# Patient Record
Sex: Female | Born: 1935 | Race: White | Hispanic: No | Smoking: Never smoker
Health system: Southern US, Community
[De-identification: ages and names within clinical notes are randomized; demographics above are authoritative.]

## PROBLEM LIST (undated history)

## (undated) DIAGNOSIS — I10 Essential (primary) hypertension: Secondary | ICD-10-CM

## (undated) DIAGNOSIS — E785 Hyperlipidemia, unspecified: Secondary | ICD-10-CM

## (undated) DIAGNOSIS — R42 Dizziness and giddiness: Secondary | ICD-10-CM

## (undated) DIAGNOSIS — F419 Anxiety disorder, unspecified: Secondary | ICD-10-CM

## (undated) DIAGNOSIS — Z66 Do not resuscitate: Secondary | ICD-10-CM

## (undated) DIAGNOSIS — K8020 Calculus of gallbladder without cholecystitis without obstruction: Secondary | ICD-10-CM

## (undated) DIAGNOSIS — R410 Disorientation, unspecified: Secondary | ICD-10-CM

## (undated) DIAGNOSIS — M1990 Unspecified osteoarthritis, unspecified site: Secondary | ICD-10-CM

## (undated) DIAGNOSIS — E559 Vitamin D deficiency, unspecified: Secondary | ICD-10-CM

## (undated) DIAGNOSIS — I6529 Occlusion and stenosis of unspecified carotid artery: Secondary | ICD-10-CM

## (undated) DIAGNOSIS — G709 Myoneural disorder, unspecified: Secondary | ICD-10-CM

## (undated) HISTORY — DX: Dizziness and giddiness: R42

## (undated) HISTORY — DX: Calculus of gallbladder without cholecystitis without obstruction: K80.20

## (undated) HISTORY — DX: Essential (primary) hypertension: I10

## (undated) HISTORY — DX: Vitamin D deficiency, unspecified: E55.9

## (undated) HISTORY — DX: Occlusion and stenosis of unspecified carotid artery: I65.29

## (undated) HISTORY — DX: Hyperlipidemia, unspecified: E78.5

## (undated) HISTORY — DX: Myoneural disorder, unspecified: G70.9

## (undated) HISTORY — DX: Anxiety disorder, unspecified: F41.9

## (undated) HISTORY — DX: Unspecified osteoarthritis, unspecified site: M19.90

---

## 2004-04-14 ENCOUNTER — Ambulatory Visit: Payer: Self-pay | Admitting: Family Medicine

## 2004-09-30 ENCOUNTER — Ambulatory Visit: Payer: Self-pay | Admitting: Family Medicine

## 2004-10-22 ENCOUNTER — Ambulatory Visit: Payer: Self-pay | Admitting: Family Medicine

## 2004-12-18 ENCOUNTER — Ambulatory Visit: Payer: Self-pay | Admitting: Family Medicine

## 2005-02-05 ENCOUNTER — Ambulatory Visit: Payer: Self-pay | Admitting: Family Medicine

## 2005-05-14 ENCOUNTER — Ambulatory Visit: Payer: Self-pay | Admitting: Family Medicine

## 2005-05-15 ENCOUNTER — Ambulatory Visit: Payer: Self-pay | Admitting: Family Medicine

## 2005-05-21 ENCOUNTER — Ambulatory Visit: Payer: Self-pay | Admitting: Family Medicine

## 2005-05-22 ENCOUNTER — Ambulatory Visit: Payer: Self-pay | Admitting: Cardiology

## 2005-05-25 ENCOUNTER — Ambulatory Visit: Payer: Self-pay | Admitting: Cardiology

## 2005-05-28 ENCOUNTER — Ambulatory Visit: Payer: Self-pay | Admitting: Cardiology

## 2005-07-27 ENCOUNTER — Ambulatory Visit: Payer: Self-pay | Admitting: Family Medicine

## 2005-10-01 ENCOUNTER — Ambulatory Visit: Payer: Self-pay | Admitting: Family Medicine

## 2006-01-26 ENCOUNTER — Ambulatory Visit: Payer: Self-pay | Admitting: Family Medicine

## 2006-03-10 ENCOUNTER — Ambulatory Visit: Payer: Self-pay | Admitting: Physician Assistant

## 2006-06-01 ENCOUNTER — Ambulatory Visit: Payer: Self-pay | Admitting: Family Medicine

## 2006-07-12 ENCOUNTER — Ambulatory Visit: Payer: Self-pay | Admitting: Family Medicine

## 2006-10-12 ENCOUNTER — Ambulatory Visit: Payer: Self-pay | Admitting: Family Medicine

## 2008-03-08 ENCOUNTER — Ambulatory Visit (HOSPITAL_COMMUNITY): Admission: RE | Admit: 2008-03-08 | Discharge: 2008-03-08 | Payer: Self-pay | Admitting: Family Medicine

## 2008-07-22 ENCOUNTER — Emergency Department (HOSPITAL_COMMUNITY): Admission: EM | Admit: 2008-07-22 | Discharge: 2008-07-22 | Payer: Self-pay | Admitting: Emergency Medicine

## 2008-12-15 ENCOUNTER — Emergency Department (HOSPITAL_COMMUNITY): Admission: EM | Admit: 2008-12-15 | Discharge: 2008-12-15 | Payer: Self-pay | Admitting: Emergency Medicine

## 2009-08-15 ENCOUNTER — Emergency Department (HOSPITAL_COMMUNITY): Admission: EM | Admit: 2009-08-15 | Discharge: 2009-08-15 | Payer: Self-pay | Admitting: Emergency Medicine

## 2009-12-07 ENCOUNTER — Emergency Department (HOSPITAL_COMMUNITY): Admission: EM | Admit: 2009-12-07 | Discharge: 2009-12-08 | Payer: Self-pay | Admitting: Emergency Medicine

## 2009-12-29 ENCOUNTER — Emergency Department (HOSPITAL_COMMUNITY): Admission: EM | Admit: 2009-12-29 | Discharge: 2009-12-30 | Payer: Self-pay | Admitting: Emergency Medicine

## 2010-02-14 ENCOUNTER — Emergency Department (HOSPITAL_COMMUNITY): Admission: EM | Admit: 2010-02-14 | Discharge: 2010-02-14 | Payer: Self-pay | Admitting: Emergency Medicine

## 2010-04-14 ENCOUNTER — Emergency Department (HOSPITAL_COMMUNITY): Admission: EM | Admit: 2010-04-14 | Discharge: 2010-04-14 | Payer: Self-pay | Admitting: Emergency Medicine

## 2010-07-29 LAB — CBC
HCT: 41.8 % (ref 36.0–46.0)
Hemoglobin: 14.5 g/dL (ref 12.0–15.0)
MCH: 31 pg (ref 26.0–34.0)
MCHC: 34.7 g/dL (ref 30.0–36.0)
MCV: 89.5 fL (ref 78.0–100.0)
Platelets: 295 10*3/uL (ref 150–400)
RBC: 4.67 MIL/uL (ref 3.87–5.11)
RDW: 13.6 % (ref 11.5–15.5)
WBC: 10.2 10*3/uL (ref 4.0–10.5)

## 2010-07-29 LAB — DIFFERENTIAL
Basophils Absolute: 0 10*3/uL (ref 0.0–0.1)
Basophils Relative: 0 % (ref 0–1)
Eosinophils Absolute: 0 10*3/uL (ref 0.0–0.7)
Eosinophils Relative: 0 % (ref 0–5)
Lymphocytes Relative: 22 % (ref 12–46)
Lymphs Abs: 2.3 10*3/uL (ref 0.7–4.0)
Monocytes Absolute: 1 10*3/uL (ref 0.1–1.0)
Monocytes Relative: 10 % (ref 3–12)
Neutro Abs: 6.8 10*3/uL (ref 1.7–7.7)
Neutrophils Relative %: 67 % (ref 43–77)

## 2010-07-29 LAB — BASIC METABOLIC PANEL
BUN: 7 mg/dL (ref 6–23)
CO2: 31 mEq/L (ref 19–32)
Calcium: 9.3 mg/dL (ref 8.4–10.5)
Chloride: 97 mEq/L (ref 96–112)
Creatinine, Ser: 0.94 mg/dL (ref 0.4–1.2)
GFR calc Af Amer: >60 mL/min (ref >60)
GFR calc non Af Amer: 56 mL/min — ABNORMAL LOW (ref >60)
Glucose, Bld: 104 mg/dL — ABNORMAL HIGH (ref 70–99)
Potassium: 4.4 mEq/L (ref 3.5–5.1)
Sodium: 135 mEq/L (ref 135–145)

## 2010-07-31 LAB — DIFFERENTIAL
Eosinophils Absolute: 0 10*3/uL (ref 0.0–0.7)
Lymphocytes Relative: 26 % (ref 12–46)
Lymphs Abs: 2.4 10*3/uL (ref 0.7–4.0)
Monocytes Relative: 11 % (ref 3–12)
Neutro Abs: 5.9 10*3/uL (ref 1.7–7.7)
Neutrophils Relative %: 63 % (ref 43–77)

## 2010-07-31 LAB — URINALYSIS, ROUTINE W REFLEX MICROSCOPIC
Bilirubin Urine: NEGATIVE
Protein, ur: NEGATIVE mg/dL
Specific Gravity, Urine: 1.007 (ref 1.005–1.030)
Urobilinogen, UA: 0.2 mg/dL (ref 0.0–1.0)

## 2010-07-31 LAB — CBC
Hemoglobin: 13.9 g/dL (ref 12.0–15.0)
MCH: 30.5 pg (ref 26.0–34.0)
RBC: 4.56 MIL/uL (ref 3.87–5.11)
WBC: 9.4 10*3/uL (ref 4.0–10.5)

## 2010-07-31 LAB — BASIC METABOLIC PANEL
CO2: 29 mEq/L (ref 19–32)
Calcium: 9.6 mg/dL (ref 8.4–10.5)
GFR calc Af Amer: 57 mL/min — ABNORMAL LOW (ref >60)
Sodium: 137 mEq/L (ref 135–145)

## 2010-07-31 LAB — POCT CARDIAC MARKERS
CKMB, poc: 2.4 ng/mL (ref 1.0–8.0)
Myoglobin, poc: 316 ng/mL (ref 12–200)
Troponin i, poc: <0.05 ng/mL (ref 0.00–0.09)

## 2010-07-31 LAB — URINE MICROSCOPIC-ADD ON

## 2010-08-01 LAB — URINALYSIS, ROUTINE W REFLEX MICROSCOPIC
Ketones, ur: NEGATIVE mg/dL
Leukocytes, UA: NEGATIVE
Nitrite: NEGATIVE
Protein, ur: NEGATIVE mg/dL
Urobilinogen, UA: 0.2 mg/dL (ref 0.0–1.0)

## 2010-08-01 LAB — CBC
MCH: 30.9 pg (ref 26.0–34.0)
MCHC: 33.8 g/dL (ref 30.0–36.0)
RDW: 13.2 % (ref 11.5–15.5)

## 2010-08-01 LAB — BASIC METABOLIC PANEL
BUN: 11 mg/dL (ref 6–23)
CO2: 31 mEq/L (ref 19–32)
Calcium: 9.2 mg/dL (ref 8.4–10.5)
GFR calc non Af Amer: 50 mL/min — ABNORMAL LOW (ref >60)
Glucose, Bld: 108 mg/dL — ABNORMAL HIGH (ref 70–99)

## 2010-08-01 LAB — DIFFERENTIAL
Basophils Absolute: 0.1 10*3/uL (ref 0.0–0.1)
Basophils Relative: 1 % (ref 0–1)
Eosinophils Absolute: 0.1 10*3/uL (ref 0.0–0.7)
Monocytes Absolute: 1 10*3/uL (ref 0.1–1.0)
Monocytes Relative: 12 % (ref 3–12)
Neutro Abs: 5 10*3/uL (ref 1.7–7.7)
Neutrophils Relative %: 57 % (ref 43–77)

## 2010-08-01 LAB — POCT CARDIAC MARKERS: CKMB, poc: 2.4 ng/mL (ref 1.0–8.0)

## 2010-08-01 LAB — URINE MICROSCOPIC-ADD ON

## 2010-08-02 LAB — URINE MICROSCOPIC-ADD ON

## 2010-08-02 LAB — URINALYSIS, ROUTINE W REFLEX MICROSCOPIC
Bilirubin Urine: NEGATIVE
Glucose, UA: NEGATIVE mg/dL
Specific Gravity, Urine: 1.01 (ref 1.005–1.030)
Urobilinogen, UA: 0.2 mg/dL (ref 0.0–1.0)

## 2010-08-11 LAB — BASIC METABOLIC PANEL
BUN: 7 mg/dL (ref 6–23)
Calcium: 9.3 mg/dL (ref 8.4–10.5)
Creatinine, Ser: 0.95 mg/dL (ref 0.4–1.2)
GFR calc non Af Amer: 55 mL/min — ABNORMAL LOW (ref >60)
Glucose, Bld: 108 mg/dL — ABNORMAL HIGH (ref 70–99)
Potassium: 3.9 mEq/L (ref 3.5–5.1)

## 2010-08-11 LAB — POCT CARDIAC MARKERS: CKMB, poc: 1.5 ng/mL (ref 1.0–8.0)

## 2010-08-11 LAB — URINALYSIS, ROUTINE W REFLEX MICROSCOPIC
Ketones, ur: NEGATIVE mg/dL
Leukocytes, UA: NEGATIVE
Nitrite: NEGATIVE
Protein, ur: NEGATIVE mg/dL
Urobilinogen, UA: 0.2 mg/dL (ref 0.0–1.0)

## 2010-08-11 LAB — URINE CULTURE
Colony Count: NO GROWTH
Culture: NO GROWTH

## 2010-08-11 LAB — CBC
HCT: 43.5 % (ref 36.0–46.0)
Platelets: 250 10*3/uL (ref 150–400)
RDW: 13.6 % (ref 11.5–15.5)
WBC: 9 10*3/uL (ref 4.0–10.5)

## 2010-08-11 LAB — URINE MICROSCOPIC-ADD ON

## 2010-08-11 LAB — DIFFERENTIAL
Basophils Absolute: 0 10*3/uL (ref 0.0–0.1)
Eosinophils Relative: 1 % (ref 0–5)
Lymphocytes Relative: 34 % (ref 12–46)
Neutrophils Relative %: 56 % (ref 43–77)

## 2010-08-11 LAB — BRAIN NATRIURETIC PEPTIDE: Pro B Natriuretic peptide (BNP): 90.7 pg/mL (ref 0.0–100.0)

## 2010-08-28 LAB — URINE MICROSCOPIC-ADD ON

## 2010-08-28 LAB — TSH: TSH: 2.888 u[IU]/mL (ref 0.350–4.500)

## 2010-08-28 LAB — POCT I-STAT, CHEM 8
Hemoglobin: 15.3 g/dL — ABNORMAL HIGH (ref 12.0–15.0)
Sodium: 136 mEq/L (ref 135–145)
TCO2: 27 mmol/L (ref 0–100)

## 2010-08-28 LAB — URINALYSIS, ROUTINE W REFLEX MICROSCOPIC
Glucose, UA: NEGATIVE mg/dL
Ketones, ur: NEGATIVE mg/dL
Leukocytes, UA: NEGATIVE
pH: 6 (ref 5.0–8.0)

## 2010-08-29 DIAGNOSIS — R011 Cardiac murmur, unspecified: Secondary | ICD-10-CM

## 2010-09-22 ENCOUNTER — Emergency Department (HOSPITAL_COMMUNITY): Payer: Medicare Other

## 2010-09-22 ENCOUNTER — Emergency Department (HOSPITAL_COMMUNITY)
Admission: EM | Admit: 2010-09-22 | Discharge: 2010-09-22 | Disposition: A | Discharge status: Home / Self Care | Payer: Medicare Other | Attending: Emergency Medicine | Admitting: Emergency Medicine

## 2010-09-22 DIAGNOSIS — IMO0002 Reserved for concepts with insufficient information to code with codable children: Secondary | ICD-10-CM | POA: Insufficient documentation

## 2010-09-22 DIAGNOSIS — I10 Essential (primary) hypertension: Secondary | ICD-10-CM | POA: Insufficient documentation

## 2010-09-22 DIAGNOSIS — R51 Headache: Secondary | ICD-10-CM | POA: Insufficient documentation

## 2010-09-22 DIAGNOSIS — H538 Other visual disturbances: Secondary | ICD-10-CM | POA: Insufficient documentation

## 2010-09-22 LAB — URINALYSIS, ROUTINE W REFLEX MICROSCOPIC
Glucose, UA: NEGATIVE mg/dL
Leukocytes, UA: NEGATIVE
Protein, ur: NEGATIVE mg/dL

## 2010-09-22 LAB — URINE MICROSCOPIC-ADD ON

## 2010-09-22 LAB — CBC
Hemoglobin: 14 g/dL (ref 12.0–15.0)
RBC: 4.63 MIL/uL (ref 3.87–5.11)
WBC: 10.8 10*3/uL — ABNORMAL HIGH (ref 4.0–10.5)

## 2010-09-22 LAB — DIFFERENTIAL
Basophils Absolute: 0 10*3/uL (ref 0.0–0.1)
Basophils Relative: 0 % (ref 0–1)
Neutro Abs: 6.1 10*3/uL (ref 1.7–7.7)
Neutrophils Relative %: 56 % (ref 43–77)

## 2010-09-22 LAB — BASIC METABOLIC PANEL
CO2: 33 mEq/L — ABNORMAL HIGH (ref 19–32)
Calcium: 10.2 mg/dL (ref 8.4–10.5)
Chloride: 96 mEq/L (ref 96–112)
GFR calc Af Amer: >60 mL/min (ref >60)
Sodium: 136 mEq/L (ref 135–145)

## 2010-11-17 ENCOUNTER — Other Ambulatory Visit: Payer: Medicare Other

## 2010-11-17 ENCOUNTER — Encounter: Payer: Medicare Other | Admitting: Vascular Surgery

## 2010-12-16 ENCOUNTER — Emergency Department (HOSPITAL_COMMUNITY)
Admission: EM | Admit: 2010-12-16 | Discharge: 2010-12-16 | Disposition: A | Discharge status: Home / Self Care | Payer: Medicare Other | Attending: Emergency Medicine | Admitting: Emergency Medicine

## 2010-12-16 ENCOUNTER — Emergency Department (HOSPITAL_COMMUNITY): Payer: Medicare Other

## 2010-12-16 DIAGNOSIS — I10 Essential (primary) hypertension: Secondary | ICD-10-CM | POA: Insufficient documentation

## 2010-12-16 DIAGNOSIS — R5381 Other malaise: Secondary | ICD-10-CM | POA: Insufficient documentation

## 2010-12-16 DIAGNOSIS — R209 Unspecified disturbances of skin sensation: Secondary | ICD-10-CM | POA: Insufficient documentation

## 2010-12-16 DIAGNOSIS — E860 Dehydration: Secondary | ICD-10-CM | POA: Insufficient documentation

## 2010-12-16 DIAGNOSIS — Z79899 Other long term (current) drug therapy: Secondary | ICD-10-CM | POA: Insufficient documentation

## 2010-12-16 DIAGNOSIS — R5383 Other fatigue: Secondary | ICD-10-CM | POA: Insufficient documentation

## 2010-12-16 LAB — BASIC METABOLIC PANEL
CO2: 30 mEq/L (ref 19–32)
Chloride: 86 mEq/L — ABNORMAL LOW (ref 96–112)
Potassium: 4.4 mEq/L (ref 3.5–5.1)
Sodium: 125 mEq/L — ABNORMAL LOW (ref 135–145)

## 2010-12-16 LAB — DIFFERENTIAL
Eosinophils Absolute: 0.1 10*3/uL (ref 0.0–0.7)
Eosinophils Relative: 0 % (ref 0–5)
Lymphs Abs: 1.4 10*3/uL (ref 0.7–4.0)
Monocytes Relative: 8 % (ref 3–12)

## 2010-12-16 LAB — URINALYSIS, ROUTINE W REFLEX MICROSCOPIC
Bilirubin Urine: NEGATIVE
Glucose, UA: NEGATIVE mg/dL
Hgb urine dipstick: NEGATIVE
Ketones, ur: NEGATIVE mg/dL
pH: 6.5 (ref 5.0–8.0)

## 2010-12-16 LAB — TROPONIN I: Troponin I: <0.3 ng/mL (ref <0.30)

## 2010-12-16 LAB — CBC
MCH: 31.4 pg (ref 26.0–34.0)
MCHC: 35.9 g/dL (ref 30.0–36.0)
MCV: 87.3 fL (ref 78.0–100.0)
Platelets: 273 10*3/uL (ref 150–400)
RDW: 12.4 % (ref 11.5–15.5)
WBC: 13.8 10*3/uL — ABNORMAL HIGH (ref 4.0–10.5)

## 2012-08-09 ENCOUNTER — Other Ambulatory Visit: Payer: Self-pay | Admitting: Family Medicine

## 2012-08-09 DIAGNOSIS — E039 Hypothyroidism, unspecified: Secondary | ICD-10-CM

## 2012-08-09 DIAGNOSIS — G894 Chronic pain syndrome: Secondary | ICD-10-CM

## 2012-08-09 NOTE — Telephone Encounter (Signed)
Routed to dr. Modesto Charon

## 2012-08-09 NOTE — Telephone Encounter (Signed)
Needs refills on thyroid and pain meds. [facility] pharmacy

## 2012-08-10 ENCOUNTER — Other Ambulatory Visit: Payer: Self-pay | Admitting: Family Medicine

## 2012-08-10 DIAGNOSIS — E039 Hypothyroidism, unspecified: Secondary | ICD-10-CM

## 2012-08-10 MED ORDER — HYDROCODONE-ACETAMINOPHEN 7.5-325 MG PO TABS: 0.5000 | ORAL_TABLET | Freq: Three times a day (TID) | ORAL | Status: DC

## 2012-08-10 MED ORDER — LEVOTHYROXINE SODIUM 50 MCG PO TABS: ORAL_TABLET | ORAL | Status: DC

## 2012-08-10 NOTE — Telephone Encounter (Signed)
meds not called in yet to [facility]. Out of thyroid meds. And another med is due to be filled also.

## 2012-08-10 NOTE — Telephone Encounter (Signed)
Spoke with Kathie Rhodes and will come pick up rx's.

## 2012-08-18 ENCOUNTER — Telehealth: Payer: Self-pay | Admitting: Family Medicine

## 2012-08-18 NOTE — Telephone Encounter (Signed)
BP ISSUES- APPT MADE

## 2012-08-23 ENCOUNTER — Ambulatory Visit (INDEPENDENT_AMBULATORY_CARE_PROVIDER_SITE_OTHER): Payer: Medicare Other | Admitting: Family Medicine

## 2012-08-23 ENCOUNTER — Encounter: Payer: Self-pay | Admitting: Family Medicine

## 2012-08-23 VITALS — BP 204/77 | HR 75 | Temp 97.5°F | Wt 188.0 lb

## 2012-08-23 DIAGNOSIS — B0229 Other postherpetic nervous system involvement: Secondary | ICD-10-CM

## 2012-08-23 DIAGNOSIS — N951 Menopausal and female climacteric states: Secondary | ICD-10-CM

## 2012-08-23 DIAGNOSIS — E559 Vitamin D deficiency, unspecified: Secondary | ICD-10-CM

## 2012-08-23 DIAGNOSIS — I6523 Occlusion and stenosis of bilateral carotid arteries: Secondary | ICD-10-CM

## 2012-08-23 DIAGNOSIS — M1990 Unspecified osteoarthritis, unspecified site: Secondary | ICD-10-CM | POA: Insufficient documentation

## 2012-08-23 DIAGNOSIS — F411 Generalized anxiety disorder: Secondary | ICD-10-CM

## 2012-08-23 DIAGNOSIS — I658 Occlusion and stenosis of other precerebral arteries: Secondary | ICD-10-CM

## 2012-08-23 DIAGNOSIS — R109 Unspecified abdominal pain: Secondary | ICD-10-CM

## 2012-08-23 DIAGNOSIS — I6529 Occlusion and stenosis of unspecified carotid artery: Secondary | ICD-10-CM | POA: Insufficient documentation

## 2012-08-23 DIAGNOSIS — K8020 Calculus of gallbladder without cholecystitis without obstruction: Secondary | ICD-10-CM

## 2012-08-23 DIAGNOSIS — R232 Flushing: Secondary | ICD-10-CM

## 2012-08-23 DIAGNOSIS — I10 Essential (primary) hypertension: Secondary | ICD-10-CM

## 2012-08-23 DIAGNOSIS — M129 Arthropathy, unspecified: Secondary | ICD-10-CM

## 2012-08-23 MED ORDER — AMLODIPINE BESYLATE-VALSARTAN 5-160 MG PO TABS: 1.0000 | ORAL_TABLET | Freq: Every day | ORAL | Status: DC

## 2012-08-23 NOTE — Assessment & Plan Note (Signed)
Stable

## 2012-08-23 NOTE — Assessment & Plan Note (Signed)
BPs readings brought by caretakers. Patient has hot flushings and anxiety with sudden surges of BPs as high as systolic in low 200s. Then her BP would fall to acceptably normal levels. Recordings are brought. Some amount of anxiety associated with BP surges , especially when alone.

## 2012-08-23 NOTE — Progress Notes (Signed)
Patient ID: Catherine Steele, female   DOB: 02/09/1919, 79 y.o.   MRN: [id number] SUBJECTIVE:  HPI: Has been having more hhot flushing and feeling her BP surging at times.   PMH/PSH: reviewed/updated in Epic  SH/FH: reviewed/updated in Epic  Allergies: reviewed/updated in Epic  Medications: reviewed/updated in Epic  Immunizations: reviewed/updated in Epic   ROS: As above in the HPI. All other systems are stable or negative.   OBJECTIVE: APPEARANCE:  Patient in no acute distress.The patient appeared well nourished and normally developed. Acyanotic.  Waist:obese.  VITAL SIGNS:BP 204/77  Pulse 75  Temp(Src) 97.5 F (36.4 C) (Oral)  Wt 188 lb (85.276 kg)   SKIN: warm and  Dry without overt rashes, tattoos and scars  HEAD and Neck: without JVD, Normal No scleral icterus  CHEST & LUNGS: Clear  CVS: Reveals the PMI to be normally located. Regular rhythm, First and Second Heart sounds are normal, and absence of murmurs, rubs or gallops.  ABDOMEN:  Benign,, no organomegaly, no masses, no Abdominal Aortic enlargement. No Guarding , no rebound. No Bruits.  RECTAL:n/a  GU:n/a  EXTREMETIES: ambulates with a walker  MUSCULOSKELETAL:   NEUROLOGIC: oriented to,place and person; nonfocal in extremities.  ASSESSMENT: HTN (hypertension) BPs readings brought by caretakers. Patient has hot flushings and anxiety with sudden surges of BPs as high as systolic in low 200s. Then her BP would fall to acceptably normal levels. Recordings are brought. Some amount of anxiety associated with BP surges , especially when alone.  Post herpetic neuralgia No major changes.  Arthritis Chronic and ambulates with a walker and assistance.  Carotid stenosis No symptoms.  Generalized anxiety disorder Major issue since she lives alone and has to hire caretakers. The caretakers have done a good job so far and patient appears to be happy with them.  Gallstones No change  Unspecified  vitamin D deficiency Stable   PLAN: Orders Placed This Encounter  Procedures  . Metanephrines, urine, 24 hour    Standing Status: Future     Number of Occurrences:      Standing Expiration Date: 08/23/2013  . Catecholamines, fractionated, urine, 24 hour    Standing Status: Future     Number of Occurrences:      Standing Expiration Date: 08/23/2013  . VMA, urine, 24 hour    Standing Status: Future     Number of Occurrences:      Standing Expiration Date: 08/23/2013    Order Specific Question:  Total Volume    Answer:  1800  . 5 HIAA, quantitative, urine, 24 hour    Standing Status: Future     Number of Occurrences:      Standing Expiration Date: 08/23/2013   No results found for this or any previous visit (from the past 24 hour(s)).                                 Meds ordered this encounter  Medications  . DISCONTD: amLODipine (NORVASC) 5 MG tablet    Sig: Take 5 mg by mouth daily.  . cloNIDine (CATAPRES) 0.1 MG tablet    Sig: Take 0.1 mg by mouth 3 (three) times daily.  Marland Kitchen atenolol (TENORMIN) 50 MG tablet    Sig: Take 50 mg by mouth daily. Take 2 tablets qd  . ALPRAZolam (XANAX) 0.25 MG tablet    Sig: Take 0.25 mg by mouth at bedtime as needed for sleep.  Marland Kitchen aspirin  325 MG tablet    Sig: Take 325 mg by mouth daily.  . Cholecalciferol (VITAMIN D3) 2000 UNITS TABS    Sig: Take 1 tablet by mouth daily.  Marland Kitchen amLODipine-valsartan (EXFORGE) 5-160 MG per tablet    Sig: Take 1 tablet by mouth daily.    Dispense:  30 tablet    Refill:  2    await work up for her BP surges. In the meantime will attempt to bring her BP to safer levels. Reviewed her BP readings and there are wide swings to the BP readings. RTC 1 week.  Kyeisha Janowicz P. Modesto Charon, M.D.

## 2012-08-23 NOTE — Assessment & Plan Note (Signed)
No change 

## 2012-08-23 NOTE — Assessment & Plan Note (Signed)
No major changes.

## 2012-08-23 NOTE — Assessment & Plan Note (Signed)
No symptoms 

## 2012-08-23 NOTE — Assessment & Plan Note (Signed)
Major issue since she lives alone and has to hire caretakers. The caretakers have done a good job so far and patient appears to be happy with them.

## 2012-08-23 NOTE — Assessment & Plan Note (Signed)
Chronic and ambulates with a walker and assistance.

## 2012-08-24 ENCOUNTER — Encounter: Payer: Self-pay | Admitting: Family Medicine

## 2012-08-25 ENCOUNTER — Other Ambulatory Visit: Payer: Medicare Other

## 2012-08-25 DIAGNOSIS — R232 Flushing: Secondary | ICD-10-CM

## 2012-08-25 DIAGNOSIS — R109 Unspecified abdominal pain: Secondary | ICD-10-CM

## 2012-08-25 DIAGNOSIS — I10 Essential (primary) hypertension: Secondary | ICD-10-CM

## 2012-08-26 ENCOUNTER — Other Ambulatory Visit (INDEPENDENT_AMBULATORY_CARE_PROVIDER_SITE_OTHER): Payer: Medicare Other

## 2012-08-26 ENCOUNTER — Other Ambulatory Visit: Payer: Self-pay | Admitting: *Deleted

## 2012-08-26 DIAGNOSIS — I10 Essential (primary) hypertension: Secondary | ICD-10-CM

## 2012-08-26 DIAGNOSIS — R109 Unspecified abdominal pain: Secondary | ICD-10-CM

## 2012-08-26 DIAGNOSIS — R232 Flushing: Secondary | ICD-10-CM

## 2012-08-26 NOTE — Progress Notes (Unsigned)
PT DROPPED OFF 24 HOUR URINE SPECIMEN

## 2012-08-26 NOTE — Addendum Note (Signed)
Addended by: Orma Render F on: 08/26/2012 04:27 PM   Modules accepted: Orders

## 2012-08-29 LAB — METANEPHRINES, URINE, 24 HOUR
Metaneph Total, Ur: 47 ug/L
Metanephrines, 24H Ur: 56 ug/24 hr (ref 45–290)
Normetanephrine, 24H Ur: 198 ug/24 hr (ref 82–500)
Normetanephrine, Ur: 165 ug/L

## 2012-08-30 LAB — VMA, URINE, 24 HOUR
VMA, 24H Ur Adult: 2.6 mg/24 hr (ref 0.0–7.5)
VMA, Urine: 2.2 mg/L

## 2012-08-31 ENCOUNTER — Encounter: Payer: Self-pay | Admitting: Family Medicine

## 2012-08-31 ENCOUNTER — Ambulatory Visit (INDEPENDENT_AMBULATORY_CARE_PROVIDER_SITE_OTHER): Payer: Medicare Other | Admitting: Family Medicine

## 2012-08-31 VITALS — BP 144/58 | HR 78 | Temp 96.6°F | Ht 64.0 in | Wt 189.0 lb

## 2012-08-31 DIAGNOSIS — F411 Generalized anxiety disorder: Secondary | ICD-10-CM

## 2012-08-31 DIAGNOSIS — R232 Flushing: Secondary | ICD-10-CM

## 2012-08-31 DIAGNOSIS — I10 Essential (primary) hypertension: Secondary | ICD-10-CM

## 2012-08-31 DIAGNOSIS — N951 Menopausal and female climacteric states: Secondary | ICD-10-CM

## 2012-08-31 MED ORDER — ALPRAZOLAM 0.25 MG PO TABS: 0.2500 mg | ORAL_TABLET | Freq: Every evening | ORAL | Status: DC | PRN

## 2012-08-31 NOTE — Progress Notes (Signed)
Patient ID: Catherine Steele, female   DOB: 1918-09-30, 79 y.o.   MRN: [id number] SUBJECTIVE: HPI: Patient is here for follow up of hypertension: denies Headache;deniesChest Pain;denies weakness;denies Shortness of Breath or Orthopnea;denies Visual changes;denies palpitations;denies cough;denies pedal edema;denies symptoms of TIA or stroke; admits to Compliance with medications. denies Problems with medications.  Has noticed red bumps which has turned to brown, and is afraid that this is shingles that has returned. They are painless, and she has had them for years.  PMH/PSH: reviewed/updated in Epic  SH/FH: reviewed/updated in Epic  Allergies: reviewed/updated in Epic  Medications: reviewed/updated in Epic  Immunizations: reviewed/updated in Epic  ROS: As above in the HPI. All other systems are stable or negative.  OBJECTIVE: APPEARANCE:  Anxious obese elderly lady, who is calmer in the presence of her two new home care helpers. Patient in no acute distress.The patient appeared well nourished and normally developed. Acyanotic. Waist: VITAL SIGNS:BP 144/58  Pulse 78  Temp(Src) 96.6 F (35.9 C) (Oral)  Ht 5\' 4"  (1.626 m)  Wt 189 lb (85.73 kg)  BMI 32.43 kg/m2   SKIN: warm and  Dry without overt rashes, tattoos and scars. The lesions that she is anxious about are all normal Seborrheic Keratosis.  HEAD and Neck: without JVD, Head and scalp: normal Eyes:No scleral icterus. Fundi normal, eye movements normal. Ears: Auricle normal, canal normal, Tympanic membranes normal, insufflation normal. Nose: normal Throat: normal Neck & thyroid: normal  CHEST & LUNGS: Chest wall: normal Lungs: Clear  CVS: Reveals the PMI to be normally located. Regular rhythm, First and Second Heart sounds are normal,  absence of murmurs, rubs or gallops. Peripheral vasculature: Radial pulses: normal  ABDOMEN:  Appearance: normal Benign,, no organomegaly, no masses, no Abdominal Aortic  enlargement. No Guarding , no rebound. No Bruits. Bowel sounds: normal  RECTAL: N/A GU: N/A  EXTREMETIES: nonedematous. Both Femoral and Pedal pulses are normal.  MUSCULOSKELETAL:  Spine: supple, ambulates with a rolling walker.  NEUROLOGIC: oriented to time,place and person; nonfocal.  ASSESSMENT: HTN (hypertension) - Better now with medication adjustment.  Generalized anxiety disorder - this is her main problem. With the 2 new homecare providers she has less complaints of acute symptoms.She is afraid of dying alone in the home,  - Plan: ALPRAZolam (XANAX) 0.25 MG tablet  Hot flashes  The main problem is her anxiety as related to aging and fear of dying alone. She is coping better with the home care helpers. BP is better now and acceptable.  PLAN: No orders of the defined types were placed in this encounter.   No results found for this or any previous visit (from the past 24 hour(s)). Meds ordered this encounter  Medications  . amLODipine-valsartan (EXFORGE) 5-320 MG per tablet    Sig: Take 1 tablet by mouth daily.  Marland Kitchen ALPRAZolam (XANAX) 0.25 MG tablet    Sig: Take 1 tablet (0.25 mg total) by mouth at bedtime as needed for sleep.    Dispense:  30 tablet    Refill:  1  Reviewed labs. The VMA is back normal. The 5 HIIA is not back yet.= This is to rule out a carcinoid tumor that would produce hot flashes and elevated blood pressure. Reviewed her meds. No change in regimen at this point. I believe we have rached stability with an acceptable BP for age.  RTC in 6 weeks  Selin Eisler P. Modesto Charon, M.D.

## 2012-09-01 ENCOUNTER — Ambulatory Visit: Payer: Self-pay | Admitting: Family Medicine

## 2012-09-01 LAB — METANEPHRINES, URINE, 24 HOUR
Metaneph Total, Ur: 46 ug/L
Metanephrines, 24H Ur: 60 ug/24 hr (ref 45–290)
Normetanephrine, 24H Ur: 224 ug/24 hr (ref 82–500)
Normetanephrine, Ur: 172 ug/L

## 2012-09-01 LAB — 5 HIAA, QUANTITATIVE, URINE, 24 HOUR

## 2012-09-03 ENCOUNTER — Emergency Department (HOSPITAL_COMMUNITY)
Admission: EM | Admit: 2012-09-03 | Discharge: 2012-09-03 | Disposition: A | Discharge status: Home / Self Care | Payer: Medicare Other | Attending: Emergency Medicine | Admitting: Emergency Medicine

## 2012-09-03 DIAGNOSIS — Z7982 Long term (current) use of aspirin: Secondary | ICD-10-CM | POA: Insufficient documentation

## 2012-09-03 DIAGNOSIS — F411 Generalized anxiety disorder: Secondary | ICD-10-CM | POA: Insufficient documentation

## 2012-09-03 DIAGNOSIS — B0229 Other postherpetic nervous system involvement: Secondary | ICD-10-CM

## 2012-09-03 DIAGNOSIS — R51 Headache: Secondary | ICD-10-CM | POA: Insufficient documentation

## 2012-09-03 DIAGNOSIS — Z79899 Other long term (current) drug therapy: Secondary | ICD-10-CM | POA: Insufficient documentation

## 2012-09-03 DIAGNOSIS — I10 Essential (primary) hypertension: Secondary | ICD-10-CM

## 2012-09-03 DIAGNOSIS — Z8679 Personal history of other diseases of the circulatory system: Secondary | ICD-10-CM | POA: Insufficient documentation

## 2012-09-03 DIAGNOSIS — Z8639 Personal history of other endocrine, nutritional and metabolic disease: Secondary | ICD-10-CM | POA: Insufficient documentation

## 2012-09-03 DIAGNOSIS — Z862 Personal history of diseases of the blood and blood-forming organs and certain disorders involving the immune mechanism: Secondary | ICD-10-CM | POA: Insufficient documentation

## 2012-09-03 DIAGNOSIS — E559 Vitamin D deficiency, unspecified: Secondary | ICD-10-CM | POA: Insufficient documentation

## 2012-09-03 LAB — POCT I-STAT, CHEM 8
BUN: 10 mg/dL (ref 6–23)
Chloride: 101 mEq/L (ref 96–112)
Sodium: 139 mEq/L (ref 135–145)
TCO2: 33 mmol/L (ref 0–100)

## 2012-09-03 MED ORDER — GABAPENTIN 300 MG PO CAPS
300.0000 mg | ORAL_CAPSULE | Freq: Once | ORAL | Status: AC
  Administered 2012-09-03: 300 mg via ORAL
  Filled 2012-09-03: qty 1

## 2012-09-03 MED ORDER — FENTANYL CITRATE 0.05 MG/ML IJ SOLN
50.0000 ug | Freq: Once | INTRAMUSCULAR | Status: AC
  Administered 2012-09-03: 50 ug via INTRAVENOUS
  Filled 2012-09-03: qty 2

## 2012-09-03 MED ORDER — CLONIDINE HCL 0.1 MG PO TABS
0.1000 mg | ORAL_TABLET | Freq: Once | ORAL | Status: AC
  Administered 2012-09-03: 0.1 mg via ORAL
  Filled 2012-09-03: qty 1

## 2012-09-03 MED ORDER — GABAPENTIN 300 MG PO CAPS: 300.0000 mg | ORAL_CAPSULE | Freq: Three times a day (TID) | ORAL | Status: DC

## 2012-09-03 NOTE — ED Provider Notes (Signed)
History     CSN: [id number]  Arrival date & time 09/03/12  1308   First MD Initiated Contact with Patient 09/03/12 0701      Chief Complaint  Patient presents with  . Facial Pain  . Hypertension    HPI According to EMS, the patient has had pain for 60years. Today she called because the pain to her left cheek got severe and so she took her own pain medication and called EMS. The patient says she had a tooth infection 60years ago and has also had shingles, however, EMS did not see any visible blisters. EMS is not sure of the patient's complaint other than facial pain, however she is hypertensive. The patient denies CP, SOB or Diaphoresis. Dr. Modesto Charon is managing her hypertension  Past Medical History  Diagnosis Date  . Anxiety   . Hyperlipidemia   . Vertigo   . Carotid stenosis   . Vitamin D deficiency     No past surgical history on file.  No family history on file.  History  Substance Use Topics  . Smoking status: Never Smoker   . Smokeless tobacco: Not on file  . Alcohol Use: No    OB History   Grav Para Term Preterm Abortions TAB SAB Ect Mult Living                  Review of Systems All other systems reviewed and are negative Allergies  Codeine and Niacin and related  Home Medications   Current Outpatient Rx  Name  Route  Sig  Dispense  Refill  . ALPRAZolam (XANAX) 0.25 MG tablet   Oral   Take 0.125 mg by mouth at bedtime as needed for sleep.         Marland Kitchen amLODipine (NORVASC) 5 MG tablet   Oral   Take 5 mg by mouth daily.         Marland Kitchen amLODipine-valsartan (EXFORGE) 5-320 MG per tablet   Oral   Take 1 tablet by mouth daily.         Marland Kitchen aspirin 325 MG tablet   Oral   Take 325 mg by mouth daily.         Marland Kitchen atenolol (TENORMIN) 50 MG tablet   Oral   Take 100 mg by mouth daily.          . Cholecalciferol (VITAMIN D3) 2000 UNITS TABS   Oral   Take 1 tablet by mouth daily.         . cloNIDine (CATAPRES) 0.1 MG tablet   Oral   Take 0.1 mg by  mouth 3 (three) times daily.         Marland Kitchen HYDROcodone-acetaminophen (NORCO) 7.5-325 MG per tablet   Oral   Take 0.5 tablets by mouth 3 (three) times daily as needed for pain.         Marland Kitchen levothyroxine (SYNTHROID, LEVOTHROID) 50 MCG tablet   Oral   Take 50-75 mcg by mouth daily before breakfast. Takes 1.5 tablets (75mg ) on Mondays, Wednesdays, and Fridays.  Takes 1 whole tablet (50mg ) on all remaining days.         Marland Kitchen gabapentin (NEURONTIN) 300 MG capsule   Oral   Take 1 capsule (300 mg total) by mouth 3 (three) times daily.   90 capsule   0     BP 191/63  Pulse 68  Temp(Src) 97.8 F (36.6 C) (Oral)  Resp 20  SpO2 96%  Physical Exam  Nursing note and vitals reviewed. Constitutional:  She is oriented to person, place, and time. She appears well-developed and well-nourished. No distress.  HENT:  Head: Normocephalic and atraumatic.  Eyes: Pupils are equal, round, and reactive to light.  Neck: Normal range of motion.  Cardiovascular: Normal rate and intact distal pulses.   Pulmonary/Chest: No respiratory distress.  Abdominal: Normal appearance. She exhibits no distension.  Musculoskeletal: Normal range of motion.  Neurological: She is alert and oriented to person, place, and time. No cranial nerve deficit.  Skin: Skin is warm and dry. No rash noted.  Psychiatric: She has a normal mood and affect. Her behavior is normal.    ED Course  Procedures (including critical care time)  Date: 09/03/2012  Rate: 70  Rhythm: normal sinus rhythm  QRS Axis: normal  Intervals: normal  ST/T Wave abnormalities: normal  Conduction Disutrbances: Left bundle-branch block  Narrative Interpretation: Unchanged EKG  Meds ordered this encounter  Medications  . fentaNYL (SUBLIMAZE) injection 50 mcg    Sig:     Sig:   . gabapentin (NEURONTIN) capsule 300 mg    Sig:       Labs Reviewed  POCT I-STAT, CHEM 8 - Abnormal; Notable for the following:    Glucose, Bld 119 (*)    Hemoglobin 15.6  (*)    All other components within normal limits   No results found.   1. Postherpetic neuralgia   2. Hypertension       MDM    Nelia Shi, MD 09/03/12 [phone]

## 2012-09-03 NOTE — ED Notes (Signed)
C/o chronic left side face pain x 14 yrs. Has seen multiple doctors for same, reports pain continues & is unbearable.

## 2012-09-03 NOTE — ED Notes (Signed)
According to EMS, the patient has had pain for 60years.  Today she called because the pain to her left cheek got severe and so she took her own pain medication and called EMS.  The patient says she had a tooth infection 60years ago and has also had shingles, however, EMS did not see any visible blisters.  EMS is not sure of the patient's complaint other than facial pain, however she is hypertensive.  The patient denies CP, SOB or Diaphoresis.  Dr. Modesto Charon is managing her hypertension.

## 2012-09-05 NOTE — Progress Notes (Signed)
Quick Note:  Call patient. Labs normal. No change in plan. Still waiting on the 5-HIAA ______

## 2012-09-07 ENCOUNTER — Other Ambulatory Visit (INDEPENDENT_AMBULATORY_CARE_PROVIDER_SITE_OTHER): Payer: Medicare Other

## 2012-09-07 DIAGNOSIS — I10 Essential (primary) hypertension: Secondary | ICD-10-CM

## 2012-09-09 LAB — 5 HIAA, QUANTITATIVE, URINE, 24 HOUR: 5-HIAA, 24 Hr Urine: 2.4 mg/24 h (ref <=6.0)

## 2012-09-09 NOTE — Progress Notes (Signed)
Quick Note:  Call patient. Labs normal. No change in plan. ______ 

## 2012-10-12 ENCOUNTER — Other Ambulatory Visit: Payer: Self-pay | Admitting: Family Medicine

## 2012-10-12 MED ORDER — HYDROCODONE-ACETAMINOPHEN 7.5-325 MG PO TABS: 0.5000 | ORAL_TABLET | Freq: Three times a day (TID) | ORAL | Status: DC | PRN

## 2012-10-12 NOTE — Telephone Encounter (Signed)
Pt has an appt tomorrow with you, i can not tell when it was last refilled. Chart sent back

## 2012-10-13 ENCOUNTER — Encounter: Payer: Self-pay | Admitting: Family Medicine

## 2012-10-13 ENCOUNTER — Ambulatory Visit (INDEPENDENT_AMBULATORY_CARE_PROVIDER_SITE_OTHER): Payer: Medicare Other | Admitting: Family Medicine

## 2012-10-13 VITALS — BP 173/72 | HR 74 | Temp 98.2°F | Wt 186.0 lb

## 2012-10-13 DIAGNOSIS — E559 Vitamin D deficiency, unspecified: Secondary | ICD-10-CM

## 2012-10-13 DIAGNOSIS — I6529 Occlusion and stenosis of unspecified carotid artery: Secondary | ICD-10-CM

## 2012-10-13 DIAGNOSIS — F411 Generalized anxiety disorder: Secondary | ICD-10-CM

## 2012-10-13 DIAGNOSIS — E039 Hypothyroidism, unspecified: Secondary | ICD-10-CM | POA: Insufficient documentation

## 2012-10-13 DIAGNOSIS — M129 Arthropathy, unspecified: Secondary | ICD-10-CM

## 2012-10-13 DIAGNOSIS — K8020 Calculus of gallbladder without cholecystitis without obstruction: Secondary | ICD-10-CM

## 2012-10-13 DIAGNOSIS — M1990 Unspecified osteoarthritis, unspecified site: Secondary | ICD-10-CM

## 2012-10-13 DIAGNOSIS — B0229 Other postherpetic nervous system involvement: Secondary | ICD-10-CM

## 2012-10-13 DIAGNOSIS — I10 Essential (primary) hypertension: Secondary | ICD-10-CM

## 2012-10-13 LAB — COMPLETE METABOLIC PANEL WITH GFR
ALT: 11 U/L (ref 0–35)
AST: 17 U/L (ref 0–37)
Albumin: 4 g/dL (ref 3.5–5.2)
Alkaline Phosphatase: 67 U/L (ref 39–117)
BUN: 17 mg/dL (ref 6–23)
CO2: 32 mEq/L (ref 19–32)
Calcium: 9.8 mg/dL (ref 8.4–10.5)
Chloride: 98 mEq/L (ref 96–112)
Creat: 1.03 mg/dL (ref 0.50–1.10)
GFR, Est African American: 55 mL/min — ABNORMAL LOW
GFR, Est Non African American: 47 mL/min — ABNORMAL LOW
Glucose, Bld: 88 mg/dL (ref 70–99)
Potassium: 4.8 mEq/L (ref 3.5–5.3)
Sodium: 139 mEq/L (ref 135–145)
Total Bilirubin: 0.5 mg/dL (ref 0.3–1.2)
Total Protein: 6.6 g/dL (ref 6.0–8.3)

## 2012-10-13 LAB — TSH: TSH: 2.407 u[IU]/mL (ref 0.350–4.500)

## 2012-10-13 MED ORDER — LOSARTAN POTASSIUM 25 MG PO TABS
25.0000 mg | ORAL_TABLET | Freq: Every day | ORAL | Status: DC
Start: 2012-10-13 — End: 2013-01-27

## 2012-10-13 NOTE — Progress Notes (Signed)
Patient ID: Catherine Steele, female   DOB: 02/22/1919, 79 y.o.   MRN: [id number] SUBJECTIVE: HPI: Patient is here for follow up of hypertension:  denies Headache;deniesChest Pain;denies weakness;denies Shortness of Breath or Orthopnea;denies Visual changes;denies palpitations;denies cough;denies pedal edema;denies symptoms of TIA or stroke; Compliance with medications: stopped the exforge did not want to take it Problems with medications.nausea with exforge So she went back on the amlodipine.  Also was at the ED recently because she had symptoms flare up of the post herpetic neuralgia.  Follow up on anxiety andPost herpetic neuralgia. Now stable    PMH/PSH: reviewed/updated in Epic  SH/FH: reviewed/updated in Epic  Allergies: reviewed/updated in Epic  Medications: reviewed/updated in Epic  Immunizations: reviewed/updated in Epic  ROS: As above in the HPI. All other systems are stable or negative.  OBJECTIVE: APPEARANCE:  Patient in no acute distress.The patient appeared well nourished and normally developed. Acyanotic. Waist: VITAL SIGNS:BP 137/72  Pulse 74  Temp(Src) 98.2 F (36.8 C) (Oral)  Wt 186 lb (84.369 kg)  BMI 31.91 kg/m2 Obese WF  SKIN: warm and  Dry without overt rashes, tattoos and scars  HEAD and Neck: without JVD, Head and scalp: normal Eyes:No scleral icterus. Fundi normal, eye movements normal. Ears: Auricle normal, canal normal, Tympanic membranes normal, insufflation normal. Nose: normal Throat: normal Neck & thyroid: normal  CHEST & LUNGS: Chest wall: normal Lungs: Clear  CVS: Reveals the PMI to be normally located. Regular rhythm, First and Second Heart sounds are normal,  absence of murmurs, rubs or gallops. Peripheral vasculature: Radial pulses: normal Dorsal pedis pulses: normal Posterior pulses: normal  ABDOMEN:  Appearance: normal Benign,, no organomegaly, no masses, no Abdominal Aortic enlargement. No Guarding , no rebound.  No Bruits. Bowel sounds: normal  RECTAL: N/A GU: N/A  EXTREMETIES: trace edematous.   NEUROLOGIC: oriented to time,place and person; nonfocal.  ASSESSMENT: HTN (hypertension) - Plan: losartan (COZAAR) 25 MG tablet, COMPLETE METABOLIC PANEL WITH GFR  Unspecified hypothyroidism - Plan: TSH  Post herpetic neuralgia  Generalized anxiety disorder  Carotid stenosis, unspecified laterality  Arthritis  Unspecified vitamin D deficiency  Gallstones  Most of her symptomatology related to anxiety  PLAN: Orders Placed This Encounter  Procedures  . COMPLETE METABOLIC PANEL WITH GFR  . TSH   Meds ordered this encounter  Medications  . losartan (COZAAR) 25 MG tablet    Sig: Take 1 tablet (25 mg total) by mouth daily.    Dispense:  30 tablet    Refill:  3   Losartan added since patient stopped the exforge.  RTC 4 weeks  Danica Camarena P. Modesto Charon, M.D.

## 2012-10-14 NOTE — Progress Notes (Signed)
Quick Note:  Call patient. Labs normal. No change in plan. ______ 

## 2012-10-31 ENCOUNTER — Other Ambulatory Visit: Payer: Self-pay | Admitting: Family Medicine

## 2012-10-31 ENCOUNTER — Telehealth: Payer: Self-pay | Admitting: Family Medicine

## 2012-10-31 MED ORDER — HYDROCODONE-ACETAMINOPHEN 7.5-325 MG PO TABS: 0.5000 | ORAL_TABLET | Freq: Three times a day (TID) | ORAL | Status: DC | PRN

## 2012-10-31 NOTE — Telephone Encounter (Signed)
Left message on vm that rx called in

## 2012-10-31 NOTE — Telephone Encounter (Signed)
Done

## 2012-11-23 ENCOUNTER — Encounter: Payer: Self-pay | Admitting: Family Medicine

## 2012-11-23 ENCOUNTER — Ambulatory Visit (INDEPENDENT_AMBULATORY_CARE_PROVIDER_SITE_OTHER): Payer: Medicare Other | Admitting: Family Medicine

## 2012-11-23 VITALS — BP 198/82 | HR 73 | Temp 97.1°F | Ht 64.0 in | Wt 184.0 lb

## 2012-11-23 DIAGNOSIS — F411 Generalized anxiety disorder: Secondary | ICD-10-CM

## 2012-11-23 DIAGNOSIS — E039 Hypothyroidism, unspecified: Secondary | ICD-10-CM

## 2012-11-23 DIAGNOSIS — I10 Essential (primary) hypertension: Secondary | ICD-10-CM

## 2012-11-23 DIAGNOSIS — B0229 Other postherpetic nervous system involvement: Secondary | ICD-10-CM

## 2012-11-23 DIAGNOSIS — I6529 Occlusion and stenosis of unspecified carotid artery: Secondary | ICD-10-CM

## 2012-11-23 DIAGNOSIS — I6523 Occlusion and stenosis of bilateral carotid arteries: Secondary | ICD-10-CM

## 2012-11-23 DIAGNOSIS — E559 Vitamin D deficiency, unspecified: Secondary | ICD-10-CM

## 2012-11-23 DIAGNOSIS — K8020 Calculus of gallbladder without cholecystitis without obstruction: Secondary | ICD-10-CM

## 2012-11-23 DIAGNOSIS — M1990 Unspecified osteoarthritis, unspecified site: Secondary | ICD-10-CM

## 2012-11-23 DIAGNOSIS — M129 Arthropathy, unspecified: Secondary | ICD-10-CM

## 2012-11-23 DIAGNOSIS — I658 Occlusion and stenosis of other precerebral arteries: Secondary | ICD-10-CM

## 2012-11-23 MED ORDER — CLONIDINE HCL 0.1 MG PO TABS: 0.1000 mg | ORAL_TABLET | Freq: Three times a day (TID) | ORAL | Status: DC

## 2012-11-23 MED ORDER — HYDROCODONE-ACETAMINOPHEN 7.5-325 MG PO TABS: 0.5000 | ORAL_TABLET | Freq: Four times a day (QID) | ORAL | Status: DC | PRN

## 2012-11-23 MED ORDER — ATENOLOL 50 MG PO TABS: 100.0000 mg | ORAL_TABLET | Freq: Every day | ORAL | Status: DC

## 2012-11-23 MED ORDER — ALPRAZOLAM 0.25 MG PO TABS: 0.2500 mg | ORAL_TABLET | Freq: Every evening | ORAL | Status: DC | PRN

## 2012-11-23 NOTE — Progress Notes (Signed)
Patient ID: Catherine Steele, female   DOB: Oct 14, 1918, 79 y.o.   MRN: [id number] SUBJECTIVE: CC: Chief Complaint  Patient presents with  . Follow-up    4 week follow-up on chronic medical conditions  . Pain    head and joints.  Has had to take more pain medication to control pain.  Marland Kitchen Hot Flashes    HPI: Patient is here for follow up of hypertension: BP 130s ran out of clonidine and BP up today. denies Headache;deniesChest Pain;denies weakness;denies Shortness of Breath or Orthopnea;denies Visual changes;denies palpitations;denies cough;denies pedal edema;denies symptoms of TIA or stroke; admits to Compliance with medications. denies Problems with medications. Hot flashes, hand  Drawn up at times, Need pain meds.the neuralgia pain and the back and joint pains are flaring up and  She cannot sleep.  Past Medical History  Diagnosis Date  . Anxiety   . Hyperlipidemia   . Vertigo   . Carotid stenosis   . Vitamin D deficiency   . Hypertension   . Neuromuscular disorder     post herpetic neuralgia  . Gallstones   . Arthritis   . Carotid stenosis   . Vitamin D deficiency    History reviewed. No pertinent past surgical history. History   Social History  . Marital Status: Widowed    Spouse Name: N/A    Number of Children: N/A  . Years of Education: N/A   Occupational History  . Not on file.   Social History Main Topics  . Smoking status: Never Smoker   . Smokeless tobacco: Never Used  . Alcohol Use: No  . Drug Use: No  . Sexually Active: Not on file   Other Topics Concern  . Not on file   Social History Narrative  . No narrative on file   History reviewed. No pertinent family history. Current Outpatient Prescriptions on File Prior to Visit  Medication Sig Dispense Refill  . amLODipine (NORVASC) 5 MG tablet Take 5 mg by mouth daily.      Marland Kitchen aspirin 325 MG tablet Take 325 mg by mouth daily.      . Cholecalciferol (VITAMIN D3) 2000 UNITS TABS Take 1 tablet by mouth  daily.      Marland Kitchen gabapentin (NEURONTIN) 300 MG capsule Take 1 capsule (300 mg total) by mouth 3 (three) times daily.  90 capsule  0  . levothyroxine (SYNTHROID, LEVOTHROID) 50 MCG tablet Take 50-75 mcg by mouth daily before breakfast. Takes 1.5 tablets (75mg ) on Mondays, Wednesdays, and Fridays.  Takes 1 whole tablet (50mg ) on all remaining days.      Marland Kitchen losartan (COZAAR) 25 MG tablet Take 1 tablet (25 mg total) by mouth daily.  30 tablet  3   No current facility-administered medications on file prior to visit.   Allergies  Allergen Reactions  . Codeine   . Niacin And Related     There is no immunization history on file for this patient. Prior to Admission medications   Medication Sig Start Date End Date Taking? Authorizing Provider  ALPRAZolam (XANAX) 0.25 MG tablet Take 1 tablet (0.25 mg total) by mouth at bedtime as needed for sleep. 11/23/12  Yes Ileana Ladd, MD  amLODipine (NORVASC) 5 MG tablet Take 5 mg by mouth daily.   Yes Historical Provider, MD  aspirin 325 MG tablet Take 325 mg by mouth daily.   Yes Historical Provider, MD  atenolol (TENORMIN) 50 MG tablet Take 2 tablets (100 mg total) by mouth daily. 11/23/12  Yes Thelma Barge  Casimiro Needle, MD  Cholecalciferol (VITAMIN D3) 2000 UNITS TABS Take 1 tablet by mouth daily.   Yes Historical Provider, MD  cloNIDine (CATAPRES) 0.1 MG tablet Take 1 tablet (0.1 mg total) by mouth 3 (three) times daily. 11/23/12  Yes Ileana Ladd, MD  gabapentin (NEURONTIN) 300 MG capsule Take 1 capsule (300 mg total) by mouth 3 (three) times daily. 09/03/12  Yes Nelia Shi, MD  HYDROcodone-acetaminophen (NORCO) 7.5-325 MG per tablet Take 0.5 tablets by mouth every 6 (six) hours as needed for pain. 11/23/12  Yes Ileana Ladd, MD  levothyroxine (SYNTHROID, LEVOTHROID) 50 MCG tablet Take 50-75 mcg by mouth daily before breakfast. Takes 1.5 tablets (75mg ) on Mondays, Wednesdays, and Fridays.  Takes 1 whole tablet (50mg ) on all remaining days.   Yes Historical Provider, MD   losartan (COZAAR) 25 MG tablet Take 1 tablet (25 mg total) by mouth daily. 10/13/12  Yes Ileana Ladd, MD      ROS: As above in the HPI. All other systems are stable or negative.  OBJECTIVE: APPEARANCE:  Patient in no acute distress.The patient appeared well nourished and normally developed. Acyanotic. Waist: VITAL SIGNS:BP 198/82  Pulse 73  Temp(Src) 97.1 F (36.2 C) (Oral)  Ht 5\' 4"  (1.626 m)  Wt 184 lb (83.462 kg)  BMI 31.57 kg/m2 Obese elderly WF ambulating with a rolling walker and the assistance of her home healthcare provider.  SKIN: warm and  Dry without overt rashes, tattoos and scars  HEAD and Neck: without JVD, Head and scalp: normal Eyes:No scleral icterus. Fundi normal, eye movements normal. Ears: Auricle normal, canal normal, Tympanic membranes normal, insufflation normal. Nose: normal Throat: normal Neck & thyroid: normal  CHEST & LUNGS: Chest wall: normal Lungs: Clear  CVS: Reveals the PMI to be normally located. Regular rhythm, First and Second Heart sounds are normal,  absence of murmurs, rubs or gallops. Peripheral vasculature: Radial pulses: normal Carotid bruits bilaterally unchanged.  ABDOMEN:  Appearance: obese soft benign. Benign, no organomegaly, no masses, no Abdominal Aortic enlargement. No Guarding , no rebound. No Bruits. Bowel sounds: normal  RECTAL: N/A GU: N/A  EXTREMETIES: trace edematous. Pedal pulses are reduced.  MUSCULOSKELETAL:  Spine: reduced ROM and pain to flex and  Extend the spine Joints: pain on ROM attempts. Knees crepitus  NEUROLOGIC: oriented to time,place and person; nonfocal. Sensory is abnormal with dysesthesia of the scalp.  Results for orders placed in visit on 10/13/12  COMPLETE METABOLIC PANEL WITH GFR      Result Value Range   Sodium 139  135 - 145 mEq/L   Potassium 4.8  3.5 - 5.3 mEq/L   Chloride 98  96 - 112 mEq/L   CO2 32  19 - 32 mEq/L   Glucose, Bld 88  70 - 99 mg/dL   BUN 17  6 - 23  mg/dL   Creat 1.61  0.96 - 0.45 mg/dL   Total Bilirubin 0.5  0.3 - 1.2 mg/dL   Alkaline Phosphatase 67  39 - 117 U/L   AST 17  0 - 37 U/L   ALT 11  0 - 35 U/L   Total Protein 6.6  6.0 - 8.3 g/dL   Albumin 4.0  3.5 - 5.2 g/dL   Calcium 9.8  8.4 - 40.9 mg/dL   GFR, Est African American 55 (*)    GFR, Est Non African American 47 (*)   TSH      Result Value Range   TSH 2.407  0.350 -  4.500 uIU/mL    ASSESSMENT: HTN (hypertension) - Plan: atenolol (TENORMIN) 50 MG tablet, cloNIDine (CATAPRES) 0.1 MG tablet, BASIC METABOLIC PANEL WITH GFR  Post herpetic neuralgia  Generalized anxiety disorder - Plan: ALPRAZolam (XANAX) 0.25 MG tablet  Carotid stenosis, bilateral  Arthritis - Plan: HYDROcodone-acetaminophen (NORCO) 7.5-325 MG per tablet  Unspecified vitamin D deficiency - Plan: Vitamin D 25 hydroxy  Gallstones  Unspecified hypothyroidism    PLAN: Orders Placed This Encounter  Procedures  . BASIC METABOLIC PANEL WITH GFR  . Vitamin D 25 hydroxy   Meds ordered this encounter  Medications  . atenolol (TENORMIN) 50 MG tablet    Sig: Take 2 tablets (100 mg total) by mouth daily.    Dispense:  60 tablet    Refill:  11  . cloNIDine (CATAPRES) 0.1 MG tablet    Sig: Take 1 tablet (0.1 mg total) by mouth 3 (three) times daily.    Dispense:  90 tablet    Refill:  11  . ALPRAZolam (XANAX) 0.25 MG tablet    Sig: Take 1 tablet (0.25 mg total) by mouth at bedtime as needed for sleep.    Dispense:  30 tablet    Refill:  3  . HYDROcodone-acetaminophen (NORCO) 7.5-325 MG per tablet    Sig: Take 0.5 tablets by mouth every 6 (six) hours as needed for pain.    Dispense:  60 tablet    Refill:  1    Do not fill until 11/29/12    Not much else to offer patient due to age and risk factors. Patient also afraid to undergo surgical procedures and not waking up, due to her high surgical risks. She therefore prefers not to have any aggressive procedures and therefore comfort care unless a  life threatening event occurs and  Attempts at salvage is going to be made.  Return in about 2 months (around 01/24/2013) for Recheck medical problems.  Kaleia Longhi P. [place] Charon, M.D.

## 2012-11-24 LAB — BASIC METABOLIC PANEL WITH GFR
BUN: 13 mg/dL (ref 6–23)
CO2: 31 mEq/L (ref 19–32)
Calcium: 10 mg/dL (ref 8.4–10.5)
Chloride: 98 mEq/L (ref 96–112)
Creat: 0.98 mg/dL (ref 0.50–1.10)
GFR, Est African American: 58 mL/min — ABNORMAL LOW
GFR, Est Non African American: 50 mL/min — ABNORMAL LOW
Glucose, Bld: 93 mg/dL (ref 70–99)
Potassium: 4.7 mEq/L (ref 3.5–5.3)
Sodium: 138 mEq/L (ref 135–145)

## 2012-11-24 LAB — VITAMIN D 25 HYDROXY (VIT D DEFICIENCY, FRACTURES): Vit D, 25-Hydroxy: 50 ng/mL (ref 30–89)

## 2012-11-27 NOTE — Progress Notes (Signed)
Quick Note:  Call patient. Labs normal. No change in plan. ______ 

## 2012-12-02 ENCOUNTER — Other Ambulatory Visit: Payer: Self-pay | Admitting: Family Medicine

## 2012-12-16 ENCOUNTER — Other Ambulatory Visit: Payer: Self-pay | Admitting: Family Medicine

## 2012-12-16 NOTE — Telephone Encounter (Signed)
Filled on 11/15/12 and said to do for 2 weeks  [facility]

## 2012-12-19 ENCOUNTER — Other Ambulatory Visit: Payer: Self-pay | Admitting: Family Medicine

## 2013-01-05 ENCOUNTER — Encounter: Payer: Self-pay | Admitting: *Deleted

## 2013-01-25 ENCOUNTER — Ambulatory Visit: Payer: Medicare Other | Admitting: Family Medicine

## 2013-01-27 ENCOUNTER — Encounter: Payer: Self-pay | Admitting: Family Medicine

## 2013-01-27 ENCOUNTER — Ambulatory Visit (INDEPENDENT_AMBULATORY_CARE_PROVIDER_SITE_OTHER): Payer: Medicare Other | Admitting: Family Medicine

## 2013-01-27 VITALS — BP 131/59 | HR 68 | Temp 97.8°F | Wt 190.2 lb

## 2013-01-27 DIAGNOSIS — K8020 Calculus of gallbladder without cholecystitis without obstruction: Secondary | ICD-10-CM

## 2013-01-27 DIAGNOSIS — I6529 Occlusion and stenosis of unspecified carotid artery: Secondary | ICD-10-CM

## 2013-01-27 DIAGNOSIS — M1990 Unspecified osteoarthritis, unspecified site: Secondary | ICD-10-CM

## 2013-01-27 DIAGNOSIS — F411 Generalized anxiety disorder: Secondary | ICD-10-CM

## 2013-01-27 DIAGNOSIS — M129 Arthropathy, unspecified: Secondary | ICD-10-CM

## 2013-01-27 DIAGNOSIS — E559 Vitamin D deficiency, unspecified: Secondary | ICD-10-CM

## 2013-01-27 DIAGNOSIS — I10 Essential (primary) hypertension: Secondary | ICD-10-CM

## 2013-01-27 DIAGNOSIS — Z8673 Personal history of transient ischemic attack (TIA), and cerebral infarction without residual deficits: Secondary | ICD-10-CM

## 2013-01-27 DIAGNOSIS — E039 Hypothyroidism, unspecified: Secondary | ICD-10-CM

## 2013-01-27 DIAGNOSIS — B0229 Other postherpetic nervous system involvement: Secondary | ICD-10-CM

## 2013-01-27 MED ORDER — HYDROCODONE-ACETAMINOPHEN 7.5-325 MG PO TABS: 0.5000 | ORAL_TABLET | Freq: Four times a day (QID) | ORAL | Status: DC | PRN

## 2013-01-27 MED ORDER — ATENOLOL 50 MG PO TABS: ORAL_TABLET | ORAL | Status: DC

## 2013-01-27 MED ORDER — LOSARTAN POTASSIUM 25 MG PO TABS: 25.0000 mg | ORAL_TABLET | Freq: Every day | ORAL | Status: DC

## 2013-01-27 MED ORDER — LEVOTHYROXINE SODIUM 50 MCG PO TABS: 50.0000 ug | ORAL_TABLET | Freq: Every day | ORAL | Status: DC

## 2013-01-27 MED ORDER — CLONIDINE HCL 0.1 MG PO TABS: 0.1000 mg | ORAL_TABLET | Freq: Three times a day (TID) | ORAL | Status: DC

## 2013-01-27 NOTE — Progress Notes (Signed)
Patient ID: Catherine Steele, female   DOB: [birth year], 79 y.o.   MRN: [id number] SUBJECTIVE: CC: Chief Complaint  Patient presents with  . Follow-up    2 month follow up. one incident on 01-14-13 when aide arrived her pb wqas 80/58  reck at 10$% pm and bp 170/82  . Medication Refill    hydrocoddone, losartan , levothyroxine , atenolol , clonidine     HPI: Patient is here for follow up of hypertension: denies Headache;deniesChest Pain;denies weakness;denies Shortness of Breath or Orthopnea;denies Visual changes;denies palpitations;denies cough;denies pedal edema; symptoms of TIA or stroke;Had a TIA event. admits to Compliance with medications. denies Problems with medications.  Past Medical History  Diagnosis Date  . Anxiety   . Hyperlipidemia   . Vertigo   . Carotid stenosis   . Vitamin D deficiency   . Hypertension   . Neuromuscular disorder     post herpetic neuralgia  . Gallstones   . Arthritis   . Carotid stenosis   . Vitamin D deficiency    No past surgical history on file. History   Social History  . Marital Status: Widowed    Spouse Name: N/A    Number of Children: N/A  . Years of Education: N/A   Occupational History  . Not on file.   Social History Main Topics  . Smoking status: Never Smoker   . Smokeless tobacco: Never Used  . Alcohol Use: No  . Drug Use: No  . Sexual Activity: Not on file   Other Topics Concern  . Not on file   Social History Narrative  . No narrative on file   No family history on file. Current Outpatient Prescriptions on File Prior to Visit  Medication Sig Dispense Refill  . ALPRAZolam (XANAX) 0.25 MG tablet Take 1 tablet (0.25 mg total) by mouth at bedtime as needed for sleep.  30 tablet  3  . amLODipine (NORVASC) 5 MG tablet TAKE ONE TABLET BY MOUTH ONE TIME DAILY  30 tablet  5  . aspirin 325 MG tablet Take 325 mg by mouth daily.      Marland Kitchen atenolol (TENORMIN) 50 MG tablet TAKE 2 TABLETS BY MOUTH DAILY AS DIRECTED  60 tablet   4  . Cholecalciferol (VITAMIN D3) 2000 UNITS TABS Take 1 tablet by mouth daily.      . cloNIDine (CATAPRES) 0.1 MG tablet Take 1 tablet (0.1 mg total) by mouth 3 (three) times daily.  90 tablet  11  . gabapentin (NEURONTIN) 300 MG capsule Take 1 capsule (300 mg total) by mouth 3 (three) times daily.  90 capsule  0  . HYDROcodone-acetaminophen (NORCO) 7.5-325 MG per tablet Take 0.5 tablets by mouth every 6 (six) hours as needed for pain.  60 tablet  1  . levothyroxine (SYNTHROID, LEVOTHROID) 50 MCG tablet Take 50-75 mcg by mouth daily before breakfast. Takes 1.5 tablets (75mg ) on Mondays, Wednesdays, and Fridays.  Takes 1 whole tablet (50mg ) on all remaining days.      Marland Kitchen losartan (COZAAR) 25 MG tablet Take 1 tablet (25 mg total) by mouth daily.  30 tablet  3  . nystatin ointment (MYCOSTATIN) APPLY TO RASH TWICE DAILY FOR 2 WEEKS  15 g  0   No current facility-administered medications on file prior to visit.   Allergies  Allergen Reactions  . Codeine   . Niacin And Related     There is no immunization history on file for this patient. Prior to Admission medications   Medication  Sig Start Date End Date Taking? Authorizing Provider  ALPRAZolam (XANAX) 0.25 MG tablet Take 1 tablet (0.25 mg total) by mouth at bedtime as needed for sleep. 11/23/12   Ileana Ladd, MD  amLODipine (NORVASC) 5 MG tablet TAKE ONE TABLET BY MOUTH ONE TIME DAILY 12/02/12   Ileana Ladd, MD  aspirin 325 MG tablet Take 325 mg by mouth daily.    Historical Provider, MD  atenolol (TENORMIN) 50 MG tablet TAKE 2 TABLETS BY MOUTH DAILY AS DIRECTED 12/19/12   Ileana Ladd, MD  Cholecalciferol (VITAMIN D3) 2000 UNITS TABS Take 1 tablet by mouth daily.    Historical Provider, MD  cloNIDine (CATAPRES) 0.1 MG tablet Take 1 tablet (0.1 mg total) by mouth 3 (three) times daily. 11/23/12   Ileana Ladd, MD  gabapentin (NEURONTIN) 300 MG capsule Take 1 capsule (300 mg total) by mouth 3 (three) times daily. 09/03/12   Nelia Shi, MD   HYDROcodone-acetaminophen (NORCO) 7.5-325 MG per tablet Take 0.5 tablets by mouth every 6 (six) hours as needed for pain. 11/23/12   Ileana Ladd, MD  levothyroxine (SYNTHROID, LEVOTHROID) 50 MCG tablet Take 50-75 mcg by mouth daily before breakfast. Takes 1.5 tablets (75mg ) on Mondays, Wednesdays, and Fridays.  Takes 1 whole tablet (50mg ) on all remaining days.    Historical Provider, MD  losartan (COZAAR) 25 MG tablet Take 1 tablet (25 mg total) by mouth daily. 10/13/12   Ileana Ladd, MD  nystatin ointment (MYCOSTATIN) APPLY TO RASH TWICE DAILY FOR 2 WEEKS 12/16/12   Ileana Ladd, MD     ROS: As above in the HPI. All other systems are stable or negative.  OBJECTIVE: APPEARANCE:  Patient in no acute distress.The patient appeared well nourished and normally developed. Acyanotic. Waist: VITAL SIGNS:BP 131/59  Pulse 68  Temp(Src) 97.8 F (36.6 C) (Oral)  Wt 190 lb 3.2 oz (86.274 kg)  BMI 32.63 kg/m2   SKIN: warm and  Dry without overt rashes, tattoos and scars  HEAD and Neck: without JVD, Head and scalp: normal Eyes:No scleral icterus. Fundi normal, eye movements normal. Ears: Auricle normal, canal normal, Tympanic membranes normal, insufflation normal. Nose: normal Throat: normal Neck & thyroid: normal  CHEST & LUNGS: Chest wall: normal Lungs: Clear  CVS: Reveals the PMI to be normally located. Regular rhythm, First and Second Heart sounds are normal,  absence of murmurs, rubs or gallops. Peripheral vasculature: Radial pulses: normal Dorsal pedis pulses: normal Posterior pulses: normal  ABDOMEN:  Appearance: normal Benign, no organomegaly, no masses, no Abdominal Aortic enlargement. No Guarding , no rebound. No Bruits. Bowel sounds: normal  RECTAL: N/A GU: N/A  EXTREMETIES: nonedematous.  MUSCULOSKELETAL:  Spine: normal Joints: intact  NEUROLOGIC: oriented to time,place and person; nonfocal. Strength is normal Sensory is normal Reflexes are  normal Cranial Nerves are normal.  ASSESSMENT:  PLAN:

## 2013-01-27 NOTE — Progress Notes (Signed)
Patient ID: Catherine Steele, female   DOB: 03-08-1919, 79 y.o.   MRN: [id number] SUBJECTIVE: CC: Chief Complaint  Patient presents with  . Follow-up    2 month follow up. one incident on 01-14-13 when aide arrived her pb wqas 80/58  reck at 10$% pm and bp 170/82  . Medication Refill    hydrocoddone, losartan , levothyroxine , atenolol , clonidine     HPI: Had an episode of TIA.characterized with headache confusion and weakness. Resolved after 15 minutes.  Patient is here for follow up of hypertension: denies Headache;deniesChest Pain;denies weakness;denies Shortness of Breath or Orthopnea;denies Visual changes;denies palpitations;denies cough;denies pedal edema;  symptoms of TIA or stroke;occured once and resolved without any residual. admits to Compliance with medications. denies Problems with medications.  This is the best she has felt and is happy with the home health care providers.  Past Medical History  Diagnosis Date  . Anxiety   . Hyperlipidemia   . Vertigo   . Carotid stenosis   . Vitamin D deficiency   . Hypertension   . Neuromuscular disorder     post herpetic neuralgia  . Gallstones   . Arthritis   . Carotid stenosis   . Vitamin D deficiency    No past surgical history on file. History   Social History  . Marital Status: Widowed    Spouse Name: N/A    Number of Children: N/A  . Years of Education: N/A   Occupational History  . Not on file.   Social History Main Topics  . Smoking status: Never Smoker   . Smokeless tobacco: Never Used  . Alcohol Use: No  . Drug Use: No  . Sexual Activity: Not on file   Other Topics Concern  . Not on file   Social History Narrative  . No narrative on file   No family history on file. Current Outpatient Prescriptions on File Prior to Visit  Medication Sig Dispense Refill  . ALPRAZolam (XANAX) 0.25 MG tablet Take 1 tablet (0.25 mg total) by mouth at bedtime as needed for sleep.  30 tablet  3  . amLODipine  (NORVASC) 5 MG tablet TAKE ONE TABLET BY MOUTH ONE TIME DAILY  30 tablet  5  . aspirin 325 MG tablet Take 325 mg by mouth daily.      . Cholecalciferol (VITAMIN D3) 2000 UNITS TABS Take 1 tablet by mouth daily.      Marland Kitchen gabapentin (NEURONTIN) 300 MG capsule Take 1 capsule (300 mg total) by mouth 3 (three) times daily.  90 capsule  0  . nystatin ointment (MYCOSTATIN) APPLY TO RASH TWICE DAILY FOR 2 WEEKS  15 g  0   No current facility-administered medications on file prior to visit.   Allergies  Allergen Reactions  . Codeine   . Niacin And Related     There is no immunization history on file for this patient. Prior to Admission medications   Medication Sig Start Date End Date Taking? Authorizing Provider  ALPRAZolam (XANAX) 0.25 MG tablet Take 1 tablet (0.25 mg total) by mouth at bedtime as needed for sleep. 11/23/12   Ileana Ladd, MD  amLODipine (NORVASC) 5 MG tablet TAKE ONE TABLET BY MOUTH ONE TIME DAILY 12/02/12   Ileana Ladd, MD  aspirin 325 MG tablet Take 325 mg by mouth daily.    Historical Provider, MD  atenolol (TENORMIN) 50 MG tablet TAKE 2 TABLETS BY MOUTH DAILY AS DIRECTED 01/27/13   Ileana Ladd, MD  Cholecalciferol (VITAMIN D3) 2000 UNITS TABS Take 1 tablet by mouth daily.    Historical Provider, MD  cloNIDine (CATAPRES) 0.1 MG tablet Take 1 tablet (0.1 mg total) by mouth 3 (three) times daily. 01/27/13   Ileana Ladd, MD  gabapentin (NEURONTIN) 300 MG capsule Take 1 capsule (300 mg total) by mouth 3 (three) times daily. 09/03/12   Nelia Shi, MD  HYDROcodone-acetaminophen (NORCO) 7.5-325 MG per tablet Take 0.5 tablets by mouth every 6 (six) hours as needed for pain. 01/27/13   Ileana Ladd, MD  levothyroxine (SYNTHROID, LEVOTHROID) 50 MCG tablet Take 1-1.5 tablets (50-75 mcg total) by mouth daily before breakfast. Takes 1.5 tablets (75mg ) on Mondays, Wednesdays, and Fridays.  Takes 1 whole tablet (50mg ) on all remaining days. 01/27/13   Ileana Ladd, MD  losartan  (COZAAR) 25 MG tablet Take 1 tablet (25 mg total) by mouth daily. 01/27/13   Ileana Ladd, MD  nystatin ointment (MYCOSTATIN) APPLY TO RASH TWICE DAILY FOR 2 WEEKS 12/16/12   Ileana Ladd, MD     ROS: As above in the HPI. All other systems are stable or negative.  OBJECTIVE: APPEARANCE:  Patient in no acute distress.The patient appeared well nourished and normally developed. Acyanotic. Waist: VITAL SIGNS:BP 131/59  Pulse 68  Temp(Src) 97.8 F (36.6 C) (Oral)  Wt 190 lb 3.2 oz (86.274 kg)  BMI 32.63 kg/m2 Looks the best I have seen her in 2 years. Well groomed.new glasses. New hair cut. Obese WF  SKIN: warm and  Dry without overt rashes, tattoos and scars  HEAD and Neck: without JVD, Head and scalp: normal Eyes:No scleral icterus. Fundi normal, eye movements normal. Ears: Auricle normal, canal normal, Tympanic membranes normal, insufflation normal. Nose: normal Throat: normal Neck & thyroid: normal carotid bruits no changes.  CHEST & LUNGS: Chest wall: normal Lungs: Clear  CVS: Reveals the PMI to be normally located. Regular rhythm, First and Second Heart sounds are normal,  absence of murmurs, rubs or gallops. Peripheral vasculature: Radial pulses: normal Dorsal pedis pulses: normal Posterior pulses: normal  ABDOMEN:  Appearance:obese Benign, no organomegaly, no masses, no Abdominal Aortic enlargement. No Guarding , no rebound. No Bruits. Bowel sounds: normal  RECTAL: N/A GU: N/A  EXTREMETIES: 2+ edema Gait disturbance.and generalized deconditioning.due to obesity.not new Arthritis of the spine and hips and knees.  NEUROLOGIC: oriented to time,place and person; nonfocal. Ambulates with a walker.  ASSESSMENT: HTN (hypertension) - Plan: losartan (COZAAR) 25 MG tablet, cloNIDine (CATAPRES) 0.1 MG tablet  Arthritis - Plan: HYDROcodone-acetaminophen (NORCO) 7.5-325 MG per tablet  Carotid stenosis, unspecified laterality  Generalized anxiety  disorder  Post herpetic neuralgia  Gallstones  Unspecified vitamin D deficiency  Unspecified hypothyroidism  History of TIAs   PLAN: Meds ordered this encounter  Medications  . losartan (COZAAR) 25 MG tablet    Sig: Take 1 tablet (25 mg total) by mouth daily.    Dispense:  30 tablet    Refill:  11  . cloNIDine (CATAPRES) 0.1 MG tablet    Sig: Take 1 tablet (0.1 mg total) by mouth 3 (three) times daily.    Dispense:  90 tablet    Refill:  11  . HYDROcodone-acetaminophen (NORCO) 7.5-325 MG per tablet    Sig: Take 0.5 tablets by mouth every 6 (six) hours as needed for pain.    Dispense:  60 tablet    Refill:  1    Do not fill until 11/29/12  . levothyroxine (SYNTHROID, LEVOTHROID) 50  MCG tablet    Sig: Take 1-1.5 tablets (50-75 mcg total) by mouth daily before breakfast. Takes 1.5 tablets (75mg ) on Mondays, Wednesdays, and Fridays.  Takes 1 whole tablet (50mg ) on all remaining days.    Dispense:  40 tablet    Refill:  11  . atenolol (TENORMIN) 50 MG tablet    Sig: TAKE 2 TABLETS BY MOUTH DAILY AS DIRECTED    Dispense:  60 tablet    Refill:  11   Continue with the level of care. Not much else to offer patient due to risks for interventions and patient declines any further interventions.  Return in about 2 months (around 03/29/2013) for Recheck medical problems.  Carolanne Mercier P. [place] Charon, M.D.

## 2013-03-03 ENCOUNTER — Other Ambulatory Visit: Payer: Self-pay | Admitting: Family Medicine

## 2013-03-03 DIAGNOSIS — M1990 Unspecified osteoarthritis, unspecified site: Secondary | ICD-10-CM

## 2013-03-03 MED ORDER — HYDROCODONE-ACETAMINOPHEN 7.5-325 MG PO TABS: 0.5000 | ORAL_TABLET | Freq: Four times a day (QID) | ORAL | Status: DC | PRN

## 2013-04-11 ENCOUNTER — Ambulatory Visit (INDEPENDENT_AMBULATORY_CARE_PROVIDER_SITE_OTHER): Payer: Medicare Other | Admitting: Family Medicine

## 2013-04-11 ENCOUNTER — Encounter: Payer: Self-pay | Admitting: Family Medicine

## 2013-04-11 VITALS — BP 158/71 | HR 69 | Temp 97.1°F | Ht 64.0 in | Wt 188.0 lb

## 2013-04-11 DIAGNOSIS — H9313 Tinnitus, bilateral: Secondary | ICD-10-CM

## 2013-04-11 DIAGNOSIS — M1990 Unspecified osteoarthritis, unspecified site: Secondary | ICD-10-CM

## 2013-04-11 DIAGNOSIS — I6529 Occlusion and stenosis of unspecified carotid artery: Secondary | ICD-10-CM

## 2013-04-11 DIAGNOSIS — E039 Hypothyroidism, unspecified: Secondary | ICD-10-CM

## 2013-04-11 DIAGNOSIS — Z8673 Personal history of transient ischemic attack (TIA), and cerebral infarction without residual deficits: Secondary | ICD-10-CM

## 2013-04-11 DIAGNOSIS — I6523 Occlusion and stenosis of bilateral carotid arteries: Secondary | ICD-10-CM

## 2013-04-11 DIAGNOSIS — F411 Generalized anxiety disorder: Secondary | ICD-10-CM

## 2013-04-11 DIAGNOSIS — I10 Essential (primary) hypertension: Secondary | ICD-10-CM

## 2013-04-11 DIAGNOSIS — K8020 Calculus of gallbladder without cholecystitis without obstruction: Secondary | ICD-10-CM

## 2013-04-11 DIAGNOSIS — M129 Arthropathy, unspecified: Secondary | ICD-10-CM

## 2013-04-11 DIAGNOSIS — B354 Tinea corporis: Secondary | ICD-10-CM | POA: Insufficient documentation

## 2013-04-11 DIAGNOSIS — E559 Vitamin D deficiency, unspecified: Secondary | ICD-10-CM

## 2013-04-11 DIAGNOSIS — I658 Occlusion and stenosis of other precerebral arteries: Secondary | ICD-10-CM

## 2013-04-11 DIAGNOSIS — B0229 Other postherpetic nervous system involvement: Secondary | ICD-10-CM

## 2013-04-11 DIAGNOSIS — H9319 Tinnitus, unspecified ear: Secondary | ICD-10-CM

## 2013-04-11 MED ORDER — HYDROCODONE-ACETAMINOPHEN 7.5-325 MG PO TABS: 0.5000 | ORAL_TABLET | Freq: Four times a day (QID) | ORAL | Status: DC | PRN

## 2013-04-11 MED ORDER — ALPRAZOLAM 0.25 MG PO TABS: 0.2500 mg | ORAL_TABLET | Freq: Every evening | ORAL | Status: DC | PRN

## 2013-04-11 MED ORDER — KETOCONAZOLE 2 % EX CREA: 1.0000 "application " | TOPICAL_CREAM | Freq: Two times a day (BID) | CUTANEOUS | Status: AC

## 2013-04-11 NOTE — Progress Notes (Signed)
Patient ID: Catherine Steele, female   DOB: 02/05/1919, 79 y.o.   MRN: [id number] SUBJECTIVE: CC: Chief Complaint  Patient presents with  . Follow-up    2 month follow up  refill alprazolanm and hydrocodone     HPI: Buzzing in the ears.  Breakout in rashes underthe breasts and groin.   Patient is here for follow up of hypertension: denies Headache;deniesChest Pain;denies weakness;denies Shortness of Breath or Orthopnea;denies Visual changes;denies palpitations;denies cough;denies pedal edema;denies symptoms of TIA or stroke; admits to Compliance with medications. denies Problems with medications.  Anxiety : has 2 excellent care providers at home. And she is less stressed and is happier with her circumstances.   Past Medical History  Diagnosis Date  . Anxiety   . Hyperlipidemia   . Vertigo   . Carotid stenosis   . Vitamin D deficiency   . Hypertension   . Neuromuscular disorder     post herpetic neuralgia  . Gallstones   . Arthritis   . Carotid stenosis   . Vitamin D deficiency    No past surgical history on file. History   Social History  . Marital Status: Widowed    Spouse Name: N/A    Number of Children: N/A  . Years of Education: N/A   Occupational History  . Not on file.   Social History Main Topics  . Smoking status: Never Smoker   . Smokeless tobacco: Never Used  . Alcohol Use: No  . Drug Use: No  . Sexual Activity: Not on file   Other Topics Concern  . Not on file   Social History Narrative  . No narrative on file   No family history on file. Current Outpatient Prescriptions on File Prior to Visit  Medication Sig Dispense Refill  . amLODipine (NORVASC) 5 MG tablet TAKE ONE TABLET BY MOUTH ONE TIME DAILY  30 tablet  5  . aspirin 325 MG tablet Take 325 mg by mouth daily.      Marland Kitchen atenolol (TENORMIN) 50 MG tablet TAKE 2 TABLETS BY MOUTH DAILY AS DIRECTED  60 tablet  11  . Cholecalciferol (VITAMIN D3) 2000 UNITS TABS Take 1 tablet by mouth daily.       . cloNIDine (CATAPRES) 0.1 MG tablet Take 1 tablet (0.1 mg total) by mouth 3 (three) times daily.  90 tablet  11  . levothyroxine (SYNTHROID, LEVOTHROID) 50 MCG tablet Take 1-1.5 tablets (50-75 mcg total) by mouth daily before breakfast. Takes 1.5 tablets (75mg ) on Mondays, Wednesdays, and Fridays.  Takes 1 whole tablet (50mg ) on all remaining days.  40 tablet  11  . losartan (COZAAR) 25 MG tablet Take 1 tablet (25 mg total) by mouth daily.  30 tablet  11  . gabapentin (NEURONTIN) 300 MG capsule Take 1 capsule (300 mg total) by mouth 3 (three) times daily.  90 capsule  0  . nystatin ointment (MYCOSTATIN) APPLY TO RASH TWICE DAILY FOR 2 WEEKS  15 g  0   No current facility-administered medications on file prior to visit.   Allergies  Allergen Reactions  . Codeine   . Niacin And Related     There is no immunization history on file for this patient. Prior to Admission medications   Medication Sig Start Date End Date Taking? Authorizing Provider  ALPRAZolam (XANAX) 0.25 MG tablet Take 1 tablet (0.25 mg total) by mouth at bedtime as needed for sleep. 11/23/12  Yes Ileana Ladd, MD  amLODipine (NORVASC) 5 MG tablet TAKE ONE TABLET  BY MOUTH ONE TIME DAILY 12/02/12  Yes Ileana Ladd, MD  aspirin 325 MG tablet Take 325 mg by mouth daily.   Yes Historical Provider, MD  atenolol (TENORMIN) 50 MG tablet TAKE 2 TABLETS BY MOUTH DAILY AS DIRECTED 01/27/13  Yes Ileana Ladd, MD  Cholecalciferol (VITAMIN D3) 2000 UNITS TABS Take 1 tablet by mouth daily.   Yes Historical Provider, MD  cloNIDine (CATAPRES) 0.1 MG tablet Take 1 tablet (0.1 mg total) by mouth 3 (three) times daily. 01/27/13  Yes Ileana Ladd, MD  HYDROcodone-acetaminophen (NORCO) 7.5-325 MG per tablet Take 0.5 tablets by mouth every 6 (six) hours as needed for pain. 03/03/13  Yes Ileana Ladd, MD  levothyroxine (SYNTHROID, LEVOTHROID) 50 MCG tablet Take 1-1.5 tablets (50-75 mcg total) by mouth daily before breakfast. Takes 1.5 tablets  (75mg ) on Mondays, Wednesdays, and Fridays.  Takes 1 whole tablet (50mg ) on all remaining days. 01/27/13  Yes Ileana Ladd, MD  losartan (COZAAR) 25 MG tablet Take 1 tablet (25 mg total) by mouth daily. 01/27/13  Yes Ileana Ladd, MD  gabapentin (NEURONTIN) 300 MG capsule Take 1 capsule (300 mg total) by mouth 3 (three) times daily. 09/03/12   Nelia Shi, MD  nystatin ointment (MYCOSTATIN) APPLY TO RASH TWICE DAILY FOR 2 WEEKS 12/16/12   Ileana Ladd, MD     ROS: As above in the HPI. All other systems are stable or negative.  OBJECTIVE: APPEARANCE:  Patient in no acute distress.The patient appeared well nourished and normally developed. Acyanotic. Waist: VITAL SIGNS:BP 158/71  Pulse 69  Temp(Src) 97.1 F (36.2 C) (Oral)  Ht 5\' 4"  (1.626 m)  Wt 188 lb (85.276 kg)  BMI 32.25 kg/m2  Obese elderly WF  SKIN: warm and  Dry without  tattoos and scars Red papular rash with satellites under each breast and in the intertrigo.  HEAD and Neck: without JVD, Head and scalp: normal Eyes:No scleral icterus. Fundi normal, eye movements normal. Ears: Auricle normal, canal normal, Tympanic membranes normal, insufflation normal. Nose: normal Throat: normal Neck & thyroid: normal  CHEST & LUNGS: Chest wall: normal Lungs: Clear  CVS: Reveals the PMI to be normally located. Regular rhythm, First and Second Heart sounds are normal,  absence of murmurs, rubs or gallops. Peripheral vasculature: Radial pulses: normal Dorsal pedis pulses: normal Posterior pulses: normal  ABDOMEN:  Appearance: Obese Benign, no organomegaly, no masses, no Abdominal Aortic enlargement. No Guarding , no rebound. No Bruits. Bowel sounds: normal  RECTAL: N/A GU: N/A  EXTREMETIES: nonedematous.  MUSCULOSKELETAL:  Spine: kyphosis Joints: ambulates with a walker.  NEUROLOGIC: oriented to time,place and person; nonfocal.  ASSESSMENT:  HTN (hypertension) - Plan: BMP8+EGFR  Tinea corporis - Plan:  ketoconazole (NIZORAL) 2 % cream  Tinnitus, bilateral  Generalized anxiety disorder - Plan: ALPRAZolam (XANAX) 0.25 MG tablet  Arthritis - Plan: HYDROcodone-acetaminophen (NORCO) 7.5-325 MG per tablet  History of TIAs  Unspecified hypothyroidism  Carotid stenosis, bilateral  Post herpetic neuralgia  Gallstones  Unspecified vitamin D deficiency  PLAN:  Orders Placed This Encounter  Procedures  . BMP8+EGFR   Meds ordered this encounter  Medications  . ketoconazole (NIZORAL) 2 % cream    Sig: Apply 1 application topically 2 (two) times daily.    Dispense:  60 g    Refill:  2  . ALPRAZolam (XANAX) 0.25 MG tablet    Sig: Take 1 tablet (0.25 mg total) by mouth at bedtime as needed for sleep.  Dispense:  30 tablet    Refill:  3  . HYDROcodone-acetaminophen (NORCO) 7.5-325 MG per tablet    Sig: Take 0.5 tablets by mouth every 6 (six) hours as needed.    Dispense:  60 tablet    Refill:  0    Do not fill until 11/29/12   Medications Discontinued During This Encounter  Medication Reason  . ALPRAZolam (XANAX) 0.25 MG tablet Reorder  . HYDROcodone-acetaminophen (NORCO) 7.5-325 MG per tablet Reorder    Continue present level of care.  Return in about 2 months (around 06/11/2013) for Recheck medical problems.  Leotha Voeltz P. [place] Charon, M.D.

## 2013-04-12 LAB — BMP8+EGFR
BUN/Creatinine Ratio: 11 (ref 11–26)
BUN: 14 mg/dL (ref 10–36)
CO2: 27 mmol/L (ref 18–29)
Calcium: 9.8 mg/dL (ref 8.6–10.2)
Chloride: 95 mmol/L — ABNORMAL LOW (ref 97–108)
Creatinine, Ser: 1.25 mg/dL — ABNORMAL HIGH (ref 0.57–1.00)
GFR calc Af Amer: 43 mL/min/{1.73_m2} — ABNORMAL LOW (ref >59)
GFR calc non Af Amer: 37 mL/min/{1.73_m2} — ABNORMAL LOW (ref >59)
Glucose: 84 mg/dL (ref 65–99)
Potassium: 4.8 mmol/L (ref 3.5–5.2)
Sodium: 141 mmol/L (ref 134–144)

## 2013-04-12 NOTE — Progress Notes (Signed)
Quick Note:  Call Patient Labs abnormal: May not be drinking enough fluids. A little behind on fluids. Recommendations: Make sure drinking enough fluids 6 to 8 glasses of liquids a day.   ______

## 2013-05-19 ENCOUNTER — Other Ambulatory Visit: Payer: Self-pay | Admitting: Family Medicine

## 2013-05-19 NOTE — Telephone Encounter (Signed)
Call patient : Prescription refilled & sent to pharmacy in [place]. 

## 2013-05-24 ENCOUNTER — Other Ambulatory Visit: Payer: Self-pay | Admitting: Family Medicine

## 2013-05-28 ENCOUNTER — Other Ambulatory Visit: Payer: Self-pay | Admitting: Family Medicine

## 2013-05-28 DIAGNOSIS — M1990 Unspecified osteoarthritis, unspecified site: Secondary | ICD-10-CM

## 2013-05-28 MED ORDER — HYDROCODONE-ACETAMINOPHEN 7.5-325 MG PO TABS: 0.5000 | ORAL_TABLET | Freq: Four times a day (QID) | ORAL | Status: DC | PRN

## 2013-05-28 NOTE — Telephone Encounter (Signed)
Rx ready for pick up. 

## 2013-05-29 NOTE — Telephone Encounter (Signed)
Pt aware rx called in.  

## 2013-05-31 ENCOUNTER — Other Ambulatory Visit: Payer: Self-pay | Admitting: Family Medicine

## 2013-06-09 ENCOUNTER — Ambulatory Visit (INDEPENDENT_AMBULATORY_CARE_PROVIDER_SITE_OTHER): Payer: Medicare Other | Admitting: Family Medicine

## 2013-06-09 ENCOUNTER — Encounter: Payer: Self-pay | Admitting: Family Medicine

## 2013-06-09 VITALS — BP 158/74 | HR 70 | Temp 97.0°F | Ht 64.0 in | Wt 186.2 lb

## 2013-06-09 DIAGNOSIS — K8020 Calculus of gallbladder without cholecystitis without obstruction: Secondary | ICD-10-CM

## 2013-06-09 DIAGNOSIS — I10 Essential (primary) hypertension: Secondary | ICD-10-CM

## 2013-06-09 DIAGNOSIS — M129 Arthropathy, unspecified: Secondary | ICD-10-CM

## 2013-06-09 DIAGNOSIS — Z8673 Personal history of transient ischemic attack (TIA), and cerebral infarction without residual deficits: Secondary | ICD-10-CM

## 2013-06-09 DIAGNOSIS — M1990 Unspecified osteoarthritis, unspecified site: Secondary | ICD-10-CM

## 2013-06-09 DIAGNOSIS — E559 Vitamin D deficiency, unspecified: Secondary | ICD-10-CM

## 2013-06-09 DIAGNOSIS — I6529 Occlusion and stenosis of unspecified carotid artery: Secondary | ICD-10-CM

## 2013-06-09 DIAGNOSIS — B0229 Other postherpetic nervous system involvement: Secondary | ICD-10-CM

## 2013-06-09 DIAGNOSIS — E039 Hypothyroidism, unspecified: Secondary | ICD-10-CM

## 2013-06-09 DIAGNOSIS — F411 Generalized anxiety disorder: Secondary | ICD-10-CM

## 2013-06-09 MED ORDER — AMLODIPINE BESYLATE 5 MG PO TABS: ORAL_TABLET | ORAL | Status: DC

## 2013-06-09 NOTE — Progress Notes (Signed)
Patient ID: Catherine Steele, female   DOB: [birth year], 80 y.o.   MRN: [id number] SUBJECTIVE: CC: Chief Complaint  Patient presents with  . Follow-up    2 month follow up some good days and bad days   . Medication Refill    amlodipine and hydrocodone  states she is only taking 1/2 tablet she self adjusted this on her on  . Rash    rash under breast using neosporin head itches .    HPI: Patient is here for follow up of hypertension: denies Headache;deniesChest Pain;denies weakness;denies Shortness of Breath or Orthopnea;denies Visual changes;denies palpitations;denies cough;denies pedal edema;denies symptoms of TIA or stroke; admits to Compliance with medications. denies Problems with medications.    Skin dry does better with skin oils applied and moisturizing cream Also the tinea rash under the breasts relapsed and the home care nurses treated with nystatin and it is  Resolving again.  She gets heart burn with the thyroid medication and she cut it in half and she is fine now.  Past Medical History  Diagnosis Date  . Anxiety   . Hyperlipidemia   . Vertigo   . Carotid stenosis   . Vitamin D deficiency   . Hypertension   . Neuromuscular disorder     post herpetic neuralgia  . Gallstones   . Arthritis   . Carotid stenosis   . Vitamin D deficiency    No past surgical history on file. History   Social History  . Marital Status: Widowed    Spouse Name: N/A    Number of Children: N/A  . Years of Education: N/A   Occupational History  . Not on file.   Social History Main Topics  . Smoking status: Never Smoker   . Smokeless tobacco: Never Used  . Alcohol Use: No  . Drug Use: No  . Sexual Activity: Not on file   Other Topics Concern  . Not on file   Social History Narrative  . No narrative on file   No family history on file. Current Outpatient Prescriptions on File Prior to Visit  Medication Sig Dispense Refill  . ALPRAZolam (XANAX) 0.25 MG tablet Take 1  tablet (0.25 mg total) by mouth at bedtime as needed for sleep.  30 tablet  3  . aspirin 325 MG tablet Take 325 mg by mouth daily.      Marland Kitchen atenolol (TENORMIN) 50 MG tablet TAKE 2 TABLETS BY MOUTH DAILY AS DIRECTED  60 tablet  11  . atenolol (TENORMIN) 50 MG tablet TAKE 2 TABLETS BY MOUTH DAILY AS DIRECTED  60 tablet  3  . Cholecalciferol (VITAMIN D3) 2000 UNITS TABS Take 1 tablet by mouth daily.      . cloNIDine (CATAPRES) 0.1 MG tablet Take 1 tablet (0.1 mg total) by mouth 3 (three) times daily.  90 tablet  11  . gabapentin (NEURONTIN) 300 MG capsule Take 1 capsule (300 mg total) by mouth 3 (three) times daily.  90 capsule  0  . HYDROcodone-acetaminophen (NORCO) 7.5-325 MG per tablet Take 0.5 tablets by mouth every 6 (six) hours as needed.  60 tablet  0  . losartan (COZAAR) 25 MG tablet Take 1 tablet (25 mg total) by mouth daily.  30 tablet  11  . nystatin ointment (MYCOSTATIN) APPLY TO RASH TWICE DAILY FOR 2 WEEKS  15 g  0   No current facility-administered medications on file prior to visit.   Allergies  Allergen Reactions  . Codeine   . Niacin  And Related     There is no immunization history on file for this patient. Prior to Admission medications   Medication Sig Start Date End Date Taking? Authorizing Provider  ALPRAZolam (XANAX) 0.25 MG tablet Take 1 tablet (0.25 mg total) by mouth at bedtime as needed for sleep. 04/11/13  Yes Vernie Shanks, MD  amLODipine (NORVASC) 5 MG tablet TAKE ONE TABLET BY MOUTH ONE  TIME DAILY 05/31/13  Yes Vernie Shanks, MD  aspirin 325 MG tablet Take 325 mg by mouth daily.   Yes Historical Provider, MD  atenolol (TENORMIN) 50 MG tablet TAKE 2 TABLETS BY MOUTH DAILY AS DIRECTED 01/27/13  Yes Vernie Shanks, MD  atenolol (TENORMIN) 50 MG tablet TAKE 2 TABLETS BY MOUTH DAILY AS DIRECTED 05/19/13  Yes Vernie Shanks, MD  Cholecalciferol (VITAMIN D3) 2000 UNITS TABS Take 1 tablet by mouth daily.   Yes Historical Provider, MD  cloNIDine (CATAPRES) 0.1 MG tablet  Take 1 tablet (0.1 mg total) by mouth 3 (three) times daily. 01/27/13  Yes Vernie Shanks, MD  gabapentin (NEURONTIN) 300 MG capsule Take 1 capsule (300 mg total) by mouth 3 (three) times daily. 09/03/12  Yes Dot Lanes, MD  HYDROcodone-acetaminophen ([place]) 7.5-325 MG per tablet Take 0.5 tablets by mouth every 6 (six) hours as needed. 05/28/13  Yes Vernie Shanks, MD  levothyroxine (SYNTHROID, LEVOTHROID) 50 MCG tablet Take 50 mcg by mouth daily before breakfast. Take 1/2 tablet daily 01/27/13  Yes Vernie Shanks, MD  losartan (COZAAR) 25 MG tablet Take 1 tablet (25 mg total) by mouth daily. 01/27/13  Yes Vernie Shanks, MD  nystatin ointment (MYCOSTATIN) APPLY TO RASH TWICE DAILY FOR 2 WEEKS 12/16/12  Yes Vernie Shanks, MD     ROS: As above in the HPI. All other systems are stable or negative.  OBJECTIVE: APPEARANCE:  Patient in no acute distress.The patient appeared well nourished and normally developed. Acyanotic. Waist: VITAL SIGNS:BP 158/74  Pulse 70  Temp(Src) 97 F (36.1 C) (Oral)  Ht _0  (1.626 m)  Wt 186 lb 3.2 oz (84.46 kg)  BMI 31.95 kg/m2 Obese eldely WF  SKIN: warm and  Dry without tattoos and scars. Dry scaly skin of forearms and legs and scalp.  HEAD and Neck: without JVD, Head and scalp: normal Eyes:No scleral icterus. Fundi normal, eye movements normal. Ears: Auricle normal, canal normal, Tympanic membranes normal, insufflation normal. Nose: normal Throat: normal Neck & thyroid: normal  CHEST & LUNGS: Chest wall: normal Lungs: Clear  CVS: Reveals the PMI to be normally located. Regular rhythm, First and Second Heart sounds are normal,  absence of murmurs, rubs or gallops. Peripheral vasculature: Radial pulses: normal Dorsal pedis pulses: normal Posterior pulses: normal  ABDOMEN:  Appearance: obese Benign, no organomegaly, no masses, no Abdominal Aortic enlargement. No Guarding , no rebound. No Bruits. Bowel sounds: normal  RECTAL: N/A GU:  N/A  EXTREMETIES: nonedematous.  MUSCULOSKELETAL:  Ambulates with a  Walker.  NEUROLOGIC: oriented to time,place and person; nonfocal. Results for orders placed in visit on 04/11/13  BMP8+EGFR      Result Value Range   Glucose 84  65 - 99 mg/dL   BUN 14  10 - 36 mg/dL   Creatinine, Ser 1.25 (*) 0.57 - 1.00 mg/dL   GFR calc non Af Amer 37 (*) >59 mL/min/1.73   GFR calc Af Amer 43 (*) >59 mL/min/1.73   BUN/Creatinine Ratio 11  11 - 26   Sodium 141  134 - 144 mmol/L   Potassium 4.8  3.5 - 5.2 mmol/L   Chloride 95 (*) 97 - 108 mmol/L   CO2 27  18 - 29 mmol/L   Calcium 9.8  8.6 - 10.2 mg/dL     ASSESSMENT:  Unspecified hypothyroidism - Plan: TSH  HTN (hypertension) - Plan: amLODipine (NORVASC) 5 MG tablet  History of TIAs  Generalized anxiety disorder  Carotid stenosis  Arthritis  Post herpetic neuralgia  Unspecified vitamin D deficiency  Gallstones  PLAN:  No change in skin care BPs all acceptable. Recorded by home care service. Scanned into chart. Orders Placed This Encounter  Procedures  . TSH   Meds ordered this encounter  Medications  . levothyroxine (SYNTHROID, LEVOTHROID) 50 MCG tablet    Sig: Take 50 mcg by mouth daily before breakfast. Take 1/2 tablet daily  . amLODipine (NORVASC) 5 MG tablet    Sig: TAKE ONE TABLET BY MOUTH ONE  TIME DAILY    Dispense:  30 tablet    Refill:  5   Medications Discontinued During This Encounter  Medication Reason  . levothyroxine (SYNTHROID, LEVOTHROID) 50 MCG tablet   . amLODipine (NORVASC) 5 MG tablet Reorder   Return in about 2 months (around 08/07/2013) for Recheck medical problems.  Jakson Delpilar P. Jacelyn Grip, M.D.

## 2013-06-10 LAB — TSH: TSH: 11.6 u[IU]/mL — ABNORMAL HIGH (ref 0.450–4.500)

## 2013-06-10 NOTE — Progress Notes (Signed)
Quick Note:  Call Patient Labs that are abnormal: Thyroid hormone levels low.  Recommendations: She will need to take a whole tablet of her thyroid pill on Mondays , Wednesdays and Fridays. 1/2 tablet all the other days. Will recheck at the next visit.   ______

## 2013-06-13 ENCOUNTER — Ambulatory Visit: Payer: Medicare Other | Admitting: Family Medicine

## 2013-07-11 ENCOUNTER — Telehealth: Payer: Self-pay | Admitting: Family Medicine

## 2013-07-11 NOTE — Telephone Encounter (Signed)
Patient is getting real hot thinks it is from her med's she was wondering if you have changed anything she has been taken? Told her you were off today would be back in the morning that if she got any wrose to call us back and we would give her an appointment

## 2013-07-14 NOTE — Telephone Encounter (Signed)
If any medication can cause this it may be the levothyroxine. Reduce to 1 tab on Mondays and Thursdays and 1/2 tablet all the other days and see if this makes a difference.

## 2013-07-14 NOTE — Telephone Encounter (Signed)
Pt notiifed and spoke to [place] and recommendations as listed below were given.  Bonita QuinLinda verbalized understanding and advised to call if further problems and pt has appt in March.

## 2013-07-31 ENCOUNTER — Telehealth: Payer: Self-pay | Admitting: Family Medicine

## 2013-08-01 ENCOUNTER — Other Ambulatory Visit: Payer: Self-pay | Admitting: *Deleted

## 2013-08-01 ENCOUNTER — Telehealth: Payer: Self-pay | Admitting: Family Medicine

## 2013-08-01 DIAGNOSIS — M1990 Unspecified osteoarthritis, unspecified site: Secondary | ICD-10-CM

## 2013-08-01 MED ORDER — HYDROCODONE-ACETAMINOPHEN 7.5-325 MG PO TABS: 0.5000 | ORAL_TABLET | Freq: Four times a day (QID) | ORAL | Status: DC | PRN

## 2013-08-01 NOTE — Telephone Encounter (Signed)
Rx ready for pick up. 

## 2013-08-01 NOTE — Telephone Encounter (Signed)
Pt aware to pick up

## 2013-08-01 NOTE — Telephone Encounter (Signed)
Caregiver called requesting refill on pain medication. Has appt with Dr. Modesto CharonWong 08/11/13. Please print if approved.

## 2013-08-01 NOTE — Telephone Encounter (Signed)
appt made

## 2013-08-08 ENCOUNTER — Ambulatory Visit: Payer: Medicare Other | Admitting: Family Medicine

## 2013-08-11 ENCOUNTER — Encounter: Payer: Self-pay | Admitting: Family Medicine

## 2013-08-11 ENCOUNTER — Ambulatory Visit (INDEPENDENT_AMBULATORY_CARE_PROVIDER_SITE_OTHER): Payer: Medicare Other | Admitting: Family Medicine

## 2013-08-11 VITALS — BP 194/80 | HR 73 | Temp 97.1°F | Ht 64.0 in | Wt 183.2 lb

## 2013-08-11 DIAGNOSIS — R232 Flushing: Secondary | ICD-10-CM

## 2013-08-11 DIAGNOSIS — Z8673 Personal history of transient ischemic attack (TIA), and cerebral infarction without residual deficits: Secondary | ICD-10-CM

## 2013-08-11 DIAGNOSIS — I6529 Occlusion and stenosis of unspecified carotid artery: Secondary | ICD-10-CM

## 2013-08-11 DIAGNOSIS — M1990 Unspecified osteoarthritis, unspecified site: Secondary | ICD-10-CM

## 2013-08-11 DIAGNOSIS — K8020 Calculus of gallbladder without cholecystitis without obstruction: Secondary | ICD-10-CM

## 2013-08-11 DIAGNOSIS — N951 Menopausal and female climacteric states: Secondary | ICD-10-CM

## 2013-08-11 DIAGNOSIS — B0229 Other postherpetic nervous system involvement: Secondary | ICD-10-CM

## 2013-08-11 DIAGNOSIS — E559 Vitamin D deficiency, unspecified: Secondary | ICD-10-CM

## 2013-08-11 DIAGNOSIS — E039 Hypothyroidism, unspecified: Secondary | ICD-10-CM

## 2013-08-11 DIAGNOSIS — I10 Essential (primary) hypertension: Secondary | ICD-10-CM

## 2013-08-11 DIAGNOSIS — M129 Arthropathy, unspecified: Secondary | ICD-10-CM

## 2013-08-11 DIAGNOSIS — F411 Generalized anxiety disorder: Secondary | ICD-10-CM

## 2013-08-11 LAB — POCT CBC
Granulocyte percent: 60.7 %G (ref 37–80)
HCT, POC: 44.6 % (ref 37.7–47.9)
Hemoglobin: 13.6 g/dL (ref 12.2–16.2)
Lymph, poc: 3.1 (ref 0.6–3.4)
MCH, POC: 28 pg (ref 27–31.2)
MCHC: 30.6 g/dL — AB (ref 31.8–35.4)
MCV: 91.5 fL (ref 80–97)
MPV: 7.5 fL (ref 0–99.8)
POC Granulocyte: 5.4 (ref 2–6.9)
POC LYMPH PERCENT: 34.6 %L (ref 10–50)
Platelet Count, POC: 316 10*3/uL (ref 142–424)
RBC: 4.9 M/uL (ref 4.04–5.48)
RDW, POC: 14.5 %
WBC: 8.9 10*3/uL (ref 4.6–10.2)

## 2013-08-11 MED ORDER — AMLODIPINE BESYLATE 5 MG PO TABS: ORAL_TABLET | ORAL | Status: DC

## 2013-08-11 MED ORDER — ATENOLOL 50 MG PO TABS: ORAL_TABLET | ORAL | Status: DC

## 2013-08-11 NOTE — Progress Notes (Signed)
Patient ID: Catherine Steele, female   DOB: [birth year], 80 y.o.   MRN: [id number] SUBJECTIVE: CC: Chief Complaint  Patient presents with  . Follow-up    3 month follow up  refll atenolol and amlodipine    HPI: Patient is here for follow up of hypertension/HLD: denies Headache;deniesChest Pain;denies weakness;denies Shortness of Breath or Orthopnea;denies Visual changes;denies palpitations;denies cough;denies pedal edema;denies symptoms of TIA or stroke; admits to Compliance with medications. denies Problems with medications. Continues to have heat in her body at night. Not really night sweats.  The caretakers thinks it is all anxiety because she is alone at nights and she gets panicky.  BPs has been 196 to 222 systolic at nights.  Past Medical History  Diagnosis Date  . Anxiety   . Hyperlipidemia   . Vertigo   . Carotid stenosis   . Vitamin D deficiency   . Hypertension   . Neuromuscular disorder     post herpetic neuralgia  . Gallstones   . Arthritis   . Carotid stenosis   . Vitamin D deficiency    No past surgical history on file. History   Social History  . Marital Status: Widowed    Spouse Name: N/A    Number of Children: N/A  . Years of Education: N/A   Occupational History  . Not on file.   Social History Main Topics  . Smoking status: Never Smoker   . Smokeless tobacco: Never Used  . Alcohol Use: No  . Drug Use: No  . Sexual Activity: Not on file   Other Topics Concern  . Not on file   Social History Narrative  . No narrative on file   No family history on file. Current Outpatient Prescriptions on File Prior to Visit  Medication Sig Dispense Refill  . ALPRAZolam (XANAX) 0.25 MG tablet Take 1 tablet (0.25 mg total) by mouth at bedtime as needed for sleep.  30 tablet  3  . aspirin 325 MG tablet Take 325 mg by mouth daily.      . Cholecalciferol (VITAMIN D3) 2000 UNITS TABS Take 1 tablet by mouth daily.      . cloNIDine (CATAPRES) 0.1 MG tablet  Take 1 tablet (0.1 mg total) by mouth 3 (three) times daily.  90 tablet  11  . gabapentin (NEURONTIN) 300 MG capsule Take 1 capsule (300 mg total) by mouth 3 (three) times daily.  90 capsule  0  . HYDROcodone-acetaminophen (NORCO) 7.5-325 MG per tablet Take 0.5 tablets by mouth every 6 (six) hours as needed.  60 tablet  0  . levothyroxine (SYNTHROID, LEVOTHROID) 50 MCG tablet Take 1/2 tablet daily      . losartan (COZAAR) 25 MG tablet Take 1 tablet (25 mg total) by mouth daily.  30 tablet  11  . nystatin ointment (MYCOSTATIN) APPLY TO RASH TWICE DAILY FOR 2 WEEKS  15 g  0   No current facility-administered medications on file prior to visit.   Allergies  Allergen Reactions  . Codeine   . Niacin And Related     There is no immunization history on file for this patient. Prior to Admission medications   Medication Sig Start Date End Date Taking? Authorizing Provider  ALPRAZolam (XANAX) 0.25 MG tablet Take 1 tablet (0.25 mg total) by mouth at bedtime as needed for sleep. 04/11/13  Yes Vernie Shanks, MD  amLODipine (NORVASC) 5 MG tablet TAKE ONE TABLET BY MOUTH ONE  TIME DAILY 06/09/13  Yes Vernie Shanks, MD  aspirin 325 MG tablet Take 325 mg by mouth daily.   Yes Historical Provider, MD  atenolol (TENORMIN) 50 MG tablet TAKE 2 TABLETS BY MOUTH DAILY AS DIRECTED 01/27/13  Yes Vernie Shanks, MD  Cholecalciferol (VITAMIN D3) 2000 UNITS TABS Take 1 tablet by mouth daily.   Yes Historical Provider, MD  cloNIDine (CATAPRES) 0.1 MG tablet Take 1 tablet (0.1 mg total) by mouth 3 (three) times daily. 01/27/13  Yes Vernie Shanks, MD  gabapentin (NEURONTIN) 300 MG capsule Take 1 capsule (300 mg total) by mouth 3 (three) times daily. 09/03/12  Yes Dot Lanes, MD  HYDROcodone-acetaminophen ([place]) 7.5-325 MG per tablet Take 0.5 tablets by mouth every 6 (six) hours as needed. 08/01/13  Yes Vernie Shanks, MD  levothyroxine (SYNTHROID, LEVOTHROID) 50 MCG tablet Take 1/2 tablet daily 01/27/13  Yes Vernie Shanks, MD  losartan (COZAAR) 25 MG tablet Take 1 tablet (25 mg total) by mouth daily. 01/27/13  Yes Vernie Shanks, MD  nystatin ointment (MYCOSTATIN) APPLY TO RASH TWICE DAILY FOR 2 WEEKS 12/16/12  Yes Vernie Shanks, MD     ROS: As above in the HPI. All other systems are stable or negative.  OBJECTIVE: APPEARANCE:  Patient in no acute distress.The patient appeared well nourished and normally developed. Acyanotic. Waist: VITAL SIGNS:BP 194/80  Pulse 73  Temp(Src) 97.1 F (36.2 C) (Oral)  Ht $R'5\' 4"'yp$  (1.626 m)  Wt 183 lb 3.2 oz (83.099 kg)  BMI 31.43 kg/m2  Elderly obese WF  SKIN: warm and  Dry without overt rashes, tattoos and scars  HEAD and Neck: without JVD, Head and scalp: normal Eyes:No scleral icterus. Fundi normal, eye movements normal. Ears: Auricle normal, canal normal, Tympanic membranes normal, insufflation normal. Nose: normal Throat: normal Neck & thyroid: normal  CHEST & LUNGS: Chest wall: normal Lungs: Clear  CVS: Reveals the PMI to be normally located. Regular rhythm, First and Second Heart sounds are normal,  absence of murmurs, rubs or gallops. Peripheral vasculature: Radial pulses: normal Dorsal pedis pulses: normal Posterior pulses: normal  ABDOMEN:  Appearance: Obese Benign, no organomegaly, no masses, no Abdominal Aortic enlargement. No Guarding , no rebound. No Bruits. Bowel sounds: normal  RECTAL: N/A GU: N/A  EXTREMETIES: nonedematous.  MUSCULOSKELETAL:  Spine: normal Joints: intact  NEUROLOGIC: oriented to time,place and person; nonfocal. Strength is normal Sensory is normal Reflexes are normal Cranial Nerves are normal.  ASSESSMENT:  Unspecified hypothyroidism - Plan: TSH  HTN (hypertension) - Plan: atenolol (TENORMIN) 50 MG tablet, amLODipine (NORVASC) 5 MG tablet, CMP14+EGFR  History of TIAs  Post herpetic neuralgia  Carotid stenosis  Arthritis  Generalized anxiety disorder - Plan: TSH  Unspecified vitamin D  deficiency - Plan: Vit D  25 hydroxy (rtn osteoporosis monitoring)  Gallstones  Hot flashes - Plan: POCT CBC, CMP14+EGFR  PLAN:  Continue with home care. Low salt. No change in medications. Discussed that the medications could be causing the symptoms as a side effect. But benefits outweigh the side effects.  Orders Placed This Encounter  Procedures  . CMP14+EGFR  . TSH  . Vit D  25 hydroxy (rtn osteoporosis monitoring)  . POCT CBC   Meds ordered this encounter  Medications  . atenolol (TENORMIN) 50 MG tablet    Sig: TAKE 2 TABLETS BY MOUTH DAILY AS DIRECTED    Dispense:  60 tablet    Refill:  11  . amLODipine (NORVASC) 5 MG tablet    Sig: TAKE ONE TABLET BY MOUTH  ONE  TIME DAILY    Dispense:  30 tablet    Refill:  11   Medications Discontinued During This Encounter  Medication Reason  . atenolol (TENORMIN) 50 MG tablet Duplicate  . atenolol (TENORMIN) 50 MG tablet Reorder  . amLODipine (NORVASC) 5 MG tablet Reorder   Return in about 4 weeks (around 09/08/2013) for recheck BP.  Kaleb Sek P. Jacelyn Grip, M.D.

## 2013-08-12 LAB — CMP14+EGFR
ALT: 12 IU/L (ref 0–32)
AST: 19 IU/L (ref 0–40)
Albumin/Globulin Ratio: 1.9 (ref 1.1–2.5)
Albumin: 4.1 g/dL (ref 3.2–4.6)
Alkaline Phosphatase: 80 IU/L (ref 39–117)
BUN/Creatinine Ratio: 13 (ref 11–26)
BUN: 13 mg/dL (ref 10–36)
CO2: 28 mmol/L (ref 18–29)
Calcium: 9.7 mg/dL (ref 8.7–10.3)
Chloride: 96 mmol/L — ABNORMAL LOW (ref 97–108)
Creatinine, Ser: 0.97 mg/dL (ref 0.57–1.00)
GFR calc Af Amer: 58 mL/min/{1.73_m2} — ABNORMAL LOW (ref >59)
GFR calc non Af Amer: 51 mL/min/{1.73_m2} — ABNORMAL LOW (ref >59)
Globulin, Total: 2.2 g/dL (ref 1.5–4.5)
Glucose: 88 mg/dL (ref 65–99)
Potassium: 4.5 mmol/L (ref 3.5–5.2)
Sodium: 140 mmol/L (ref 134–144)
Total Bilirubin: 0.3 mg/dL (ref 0.0–1.2)
Total Protein: 6.3 g/dL (ref 6.0–8.5)

## 2013-08-12 LAB — TSH: TSH: 6.98 u[IU]/mL — ABNORMAL HIGH (ref 0.450–4.500)

## 2013-08-12 LAB — VITAMIN D 25 HYDROXY (VIT D DEFICIENCY, FRACTURES): Vit D, 25-Hydroxy: 26.9 ng/mL — ABNORMAL LOW (ref 30.0–100.0)

## 2013-08-12 NOTE — Progress Notes (Signed)
Quick Note:  Call Patient Labs that are abnormal:  Thyroid reflects low hormone The vitamin D is low The rest are at goal  Recommendations: Double the vitamin D to 4,000 units daily Increased the thyroid medication to a whole tablet on Monday, Wednesdays, and Fridays.and 1/2 tablet on all the other days   ______

## 2013-08-16 ENCOUNTER — Encounter: Payer: Self-pay | Admitting: *Deleted

## 2013-09-11 ENCOUNTER — Encounter: Payer: Self-pay | Admitting: Family Medicine

## 2013-09-11 ENCOUNTER — Ambulatory Visit (INDEPENDENT_AMBULATORY_CARE_PROVIDER_SITE_OTHER): Payer: Medicare Other | Admitting: Family Medicine

## 2013-09-11 VITALS — BP 189/62 | HR 71 | Temp 98.3°F | Ht 64.0 in | Wt 183.2 lb

## 2013-09-11 DIAGNOSIS — I6529 Occlusion and stenosis of unspecified carotid artery: Secondary | ICD-10-CM

## 2013-09-11 DIAGNOSIS — E559 Vitamin D deficiency, unspecified: Secondary | ICD-10-CM

## 2013-09-11 DIAGNOSIS — B0229 Other postherpetic nervous system involvement: Secondary | ICD-10-CM

## 2013-09-11 DIAGNOSIS — F411 Generalized anxiety disorder: Secondary | ICD-10-CM

## 2013-09-11 DIAGNOSIS — M1990 Unspecified osteoarthritis, unspecified site: Secondary | ICD-10-CM

## 2013-09-11 DIAGNOSIS — I10 Essential (primary) hypertension: Secondary | ICD-10-CM

## 2013-09-11 DIAGNOSIS — M129 Arthropathy, unspecified: Secondary | ICD-10-CM

## 2013-09-11 DIAGNOSIS — E039 Hypothyroidism, unspecified: Secondary | ICD-10-CM

## 2013-09-11 DIAGNOSIS — Z8673 Personal history of transient ischemic attack (TIA), and cerebral infarction without residual deficits: Secondary | ICD-10-CM

## 2013-09-11 DIAGNOSIS — K8020 Calculus of gallbladder without cholecystitis without obstruction: Secondary | ICD-10-CM

## 2013-09-11 MED ORDER — ALPRAZOLAM 0.25 MG PO TABS: 0.2500 mg | ORAL_TABLET | Freq: Every evening | ORAL | Status: DC | PRN

## 2013-09-11 MED ORDER — HYDROCODONE-ACETAMINOPHEN 7.5-325 MG PO TABS: 0.5000 | ORAL_TABLET | Freq: Four times a day (QID) | ORAL | Status: DC | PRN

## 2013-09-11 NOTE — Progress Notes (Signed)
Patient ID: Catherine Steele, female   DOB: 02-Apr-1919, 80 y.o.   MRN: [id number] SUBJECTIVE: CC: Chief Complaint  Patient presents with  . Follow-up    1 month follow up chronic prolbems refill lxanax and hydrocoodone      HPI: Here for  follow up on hypothyroidism: adjusted dose to 1 tab three times a week and 1/2 tab the other days. Doing okay on this regimen.  Increased her Vitamin D to 4,000 units daily as directed.  Patient is here for follow up of hypertension: denies Headache;deniesChest Pain;denies weakness;denies Shortness of Breath or Orthopnea;denies Visual changes;denies palpitations;denies cough;denies pedal edema;denies symptoms of TIA or stroke; admits to Compliance with medications. denies Problems with medications. At home BPs are at goal per caretakers.   patient requests shingles shots.   Past Medical History  Diagnosis Date  . Anxiety   . Hyperlipidemia   . Vertigo   . Carotid stenosis   . Vitamin D deficiency   . Hypertension   . Neuromuscular disorder     post herpetic neuralgia  . Gallstones   . Arthritis   . Carotid stenosis   . Vitamin D deficiency    No past surgical history on file. History   Social History  . Marital Status: Widowed    Spouse Name: N/A    Number of Children: N/A  . Years of Education: N/A   Occupational History  . Not on file.   Social History Main Topics  . Smoking status: Never Smoker   . Smokeless tobacco: Never Used  . Alcohol Use: No  . Drug Use: No  . Sexual Activity: Not on file   Other Topics Concern  . Not on file   Social History Narrative  . No narrative on file   No family history on file. Current Outpatient Prescriptions on File Prior to Visit  Medication Sig Dispense Refill  . amLODipine (NORVASC) 5 MG tablet TAKE ONE TABLET BY MOUTH ONE  TIME DAILY  30 tablet  11  . aspirin 325 MG tablet Take 325 mg by mouth daily.      Marland Kitchen atenolol (TENORMIN) 50 MG tablet TAKE 2 TABLETS BY MOUTH DAILY AS  DIRECTED  60 tablet  11  . Cholecalciferol (VITAMIN D3) 2000 UNITS TABS Take 1 tablet by mouth daily.      . cloNIDine (CATAPRES) 0.1 MG tablet Take 1 tablet (0.1 mg total) by mouth 3 (three) times daily.  90 tablet  11  . gabapentin (NEURONTIN) 300 MG capsule Take 1 capsule (300 mg total) by mouth 3 (three) times daily.  90 capsule  0  . levothyroxine (SYNTHROID, LEVOTHROID) 50 MCG tablet Take 1/2 tablet daily      . losartan (COZAAR) 25 MG tablet Take 1 tablet (25 mg total) by mouth daily.  30 tablet  11  . nystatin ointment (MYCOSTATIN) APPLY TO RASH TWICE DAILY FOR 2 WEEKS  15 g  0   No current facility-administered medications on file prior to visit.   Allergies  Allergen Reactions  . Codeine   . Niacin And Related     There is no immunization history on file for this patient. Prior to Admission medications   Medication Sig Start Date End Date Taking? Authorizing Provider  ALPRAZolam (XANAX) 0.25 MG tablet Take 1 tablet (0.25 mg total) by mouth at bedtime as needed for sleep. 04/11/13  Yes Vernie Shanks, MD  amLODipine (NORVASC) 5 MG tablet TAKE ONE TABLET BY MOUTH ONE  TIME DAILY  08/11/13  Yes Vernie Shanks, MD  aspirin 325 MG tablet Take 325 mg by mouth daily.   Yes Historical Provider, MD  atenolol (TENORMIN) 50 MG tablet TAKE 2 TABLETS BY MOUTH DAILY AS DIRECTED 08/11/13  Yes Vernie Shanks, MD  Cholecalciferol (VITAMIN D3) 2000 UNITS TABS Take 1 tablet by mouth daily.   Yes Historical Provider, MD  cloNIDine (CATAPRES) 0.1 MG tablet Take 1 tablet (0.1 mg total) by mouth 3 (three) times daily. 01/27/13  Yes Vernie Shanks, MD  gabapentin (NEURONTIN) 300 MG capsule Take 1 capsule (300 mg total) by mouth 3 (three) times daily. 09/03/12  Yes Dot Lanes, MD  HYDROcodone-acetaminophen ([place]) 7.5-325 MG per tablet Take 0.5 tablets by mouth every 6 (six) hours as needed. 08/01/13  Yes Vernie Shanks, MD  levothyroxine (SYNTHROID, LEVOTHROID) 50 MCG tablet Take 1/2 tablet daily 01/27/13   Yes Vernie Shanks, MD  losartan (COZAAR) 25 MG tablet Take 1 tablet (25 mg total) by mouth daily. 01/27/13  Yes Vernie Shanks, MD  nystatin ointment (MYCOSTATIN) APPLY TO RASH TWICE DAILY FOR 2 WEEKS 12/16/12  Yes Vernie Shanks, MD     ROS: As above in the HPI. All other systems are stable or negative.  OBJECTIVE: APPEARANCE:  Patient in no acute distress.The patient appeared well nourished and normally developed. Acyanotic. Waist: VITAL SIGNS:BP 189/62  Pulse 71  Temp(Src) 98.3 F (36.8 C) (Oral)  Ht $R'5\' 4"'jr$  (1.626 m)  Wt 183 lb 3.2 oz (83.099 kg)  BMI 31.43 kg/m2  Elderly WF Obese BPs at home are in normal range for her.  SKIN: warm and  Dry without overt rashes, tattoos and scars  HEAD and Neck: without JVD, Head and scalp: normal Eyes:No scleral icterus. Fundi normal, eye movements normal. Ears: Auricle normal, canal normal, Tympanic membranes normal, insufflation normal. Nose: normal Throat: normal Neck & thyroid: normal  CHEST & LUNGS: Chest wall: normal Lungs: Clear  CVS: Reveals the PMI to be normally located. Regular rhythm, First and Second Heart sounds are normal,  absence of murmurs, rubs or gallops. Peripheral vasculature: Radial pulses: normal  ABDOMEN:  Appearance: Obese Benign, no organomegaly, no masses, no Abdominal Aortic enlargement. No Guarding , no rebound. No Bruits. Bowel sounds: normal  RECTAL: N/A GU: N/A  EXTREMETIES: trace edema legs.  MUSCULOSKELETAL:  Spine: normal Ambulates with a walker  NEUROLOGIC: oriented to time,place and person; nonfocal. Cranial Nerves are normal.   Results for orders placed in visit on 08/11/13  CMP14+EGFR      Result Value Ref Range   Glucose 88  65 - 99 mg/dL   BUN 13  10 - 36 mg/dL   Creatinine, Ser 0.97  0.57 - 1.00 mg/dL   GFR calc non Af Amer 51 (*) >59 mL/min/1.73   GFR calc Af Amer 58 (*) >59 mL/min/1.73   BUN/Creatinine Ratio 13  11 - 26   Sodium 140  134 - 144 mmol/L    Potassium 4.5  3.5 - 5.2 mmol/L   Chloride 96 (*) 97 - 108 mmol/L   CO2 28  18 - 29 mmol/L   Calcium 9.7  8.7 - 10.3 mg/dL   Total Protein 6.3  6.0 - 8.5 g/dL   Albumin 4.1  3.2 - 4.6 g/dL   Globulin, Total 2.2  1.5 - 4.5 g/dL   Albumin/Globulin Ratio 1.9  1.1 - 2.5   Total Bilirubin 0.3  0.0 - 1.2 mg/dL   Alkaline Phosphatase 80  39 -  117 IU/L   AST 19  0 - 40 IU/L   ALT 12  0 - 32 IU/L  TSH      Result Value Ref Range   TSH 6.980 (*) 0.450 - 4.500 uIU/mL  VITAMIN D 25 HYDROXY      Result Value Ref Range   Vit D, 25-Hydroxy 26.9 (*) 30.0 - 100.0 ng/mL  POCT CBC      Result Value Ref Range   WBC 8.9  4.6 - 10.2 K/uL   Lymph, poc 3.1  0.6 - 3.4   POC LYMPH PERCENT 34.6  10 - 50 %L   MID (cbc)    0 - 0.9   POC MID %    0 - 12 %M   POC Granulocyte 5.4  2 - 6.9   Granulocyte percent 60.7  37 - 80 %G   RBC 4.9  4.04 - 5.48 M/uL   Hemoglobin 13.6  12.2 - 16.2 g/dL   HCT, POC 44.6  37.7 - 47.9 %   MCV 91.5  80 - 97 fL   MCH, POC 28.0  27 - 31.2 pg   MCHC 30.6 (*) 31.8 - 35.4 g/dL   RDW, POC 14.5     Platelet Count, POC 316.0  142 - 424 K/uL   MPV 7.5  0 - 99.8 fL    ASSESSMENT:  Unspecified hypothyroidism - Plan: TSH  HTN (hypertension)  History of TIAs  Post herpetic neuralgia  Generalized anxiety disorder - Plan: ALPRAZolam (XANAX) 0.25 MG tablet  Carotid stenosis  Arthritis - Plan: HYDROcodone-acetaminophen (NORCO) 7.5-325 MG per tablet  Unspecified vitamin D deficiency - Plan: Vit D  25 hydroxy (rtn osteoporosis monitoring)  Gallstones  PLAN: Continue present level of care. Await labs to see if an adjustment is needed. Rx for a zostavax at the pharmacy.  Orders Placed This Encounter  Procedures  . TSH  . Vit D  25 hydroxy (rtn osteoporosis monitoring)   Meds ordered this encounter  Medications  . HYDROcodone-acetaminophen ([place]CO) 7.5-325 MG per tablet    Sig: Take 0.5 tablets by mouth every 6 (six) hours as needed.    Dispense:  60 tablet     Refill:  0  . ALPRAZolam (XANAX) 0.25 MG tablet    Sig: Take 1 tablet (0.25 mg total) by mouth at bedtime as needed for sleep.    Dispense:  30 tablet    Refill:  3   Medications Discontinued During This Encounter  Medication Reason  . HYDROcodone-acetaminophen ([place]CO) 7.5-325 MG per tablet Reorder  . ALPRAZolam (XANAX) 0.25 MG tablet Reorder   Return in about 2 months (around 11/11/2013) for Recheck medical problems.  Esvin Hnat P. Jacelyn Grip, M.D.

## 2013-09-12 ENCOUNTER — Other Ambulatory Visit: Payer: Self-pay | Admitting: Family Medicine

## 2013-09-12 DIAGNOSIS — E039 Hypothyroidism, unspecified: Secondary | ICD-10-CM

## 2013-09-12 LAB — TSH: TSH: 11.4 u[IU]/mL — ABNORMAL HIGH (ref 0.450–4.500)

## 2013-09-12 LAB — VITAMIN D 25 HYDROXY (VIT D DEFICIENCY, FRACTURES): Vit D, 25-Hydroxy: 31.6 ng/mL (ref 30.0–100.0)

## 2013-09-12 MED ORDER — LEVOTHYROXINE SODIUM 50 MCG PO TABS: 50.0000 ug | ORAL_TABLET | Freq: Every day | ORAL | Status: DC

## 2013-09-28 ENCOUNTER — Other Ambulatory Visit: Payer: Self-pay

## 2013-09-28 MED ORDER — NYSTATIN 100000 UNIT/GM EX OINT: TOPICAL_OINTMENT | CUTANEOUS | Status: DC

## 2013-10-16 ENCOUNTER — Telehealth: Payer: Self-pay | Admitting: Family Medicine

## 2013-10-16 DIAGNOSIS — M1990 Unspecified osteoarthritis, unspecified site: Secondary | ICD-10-CM

## 2013-10-16 NOTE — Telephone Encounter (Signed)
Last seen and filled by [place] Charon on 09/11/13, next appt 11/13/13 with you. If approved Rx will print

## 2013-10-18 ENCOUNTER — Other Ambulatory Visit: Payer: Self-pay | Admitting: Family

## 2013-10-18 DIAGNOSIS — M1990 Unspecified osteoarthritis, unspecified site: Secondary | ICD-10-CM

## 2013-10-18 MED ORDER — HYDROCODONE-ACETAMINOPHEN 7.5-325 MG PO TABS: 0.5000 | ORAL_TABLET | Freq: Four times a day (QID) | ORAL | Status: DC | PRN

## 2013-11-13 ENCOUNTER — Ambulatory Visit: Payer: Medicare Other | Admitting: Family

## 2013-11-22 ENCOUNTER — Encounter: Payer: Self-pay | Admitting: Family

## 2013-11-22 ENCOUNTER — Ambulatory Visit (INDEPENDENT_AMBULATORY_CARE_PROVIDER_SITE_OTHER): Payer: Medicare Other | Admitting: Family

## 2013-11-22 VITALS — BP 144/59 | HR 65 | Temp 97.8°F | Ht 64.0 in | Wt 183.0 lb

## 2013-11-22 DIAGNOSIS — M129 Arthropathy, unspecified: Secondary | ICD-10-CM

## 2013-11-22 DIAGNOSIS — M1990 Unspecified osteoarthritis, unspecified site: Secondary | ICD-10-CM

## 2013-11-22 DIAGNOSIS — E559 Vitamin D deficiency, unspecified: Secondary | ICD-10-CM

## 2013-11-22 DIAGNOSIS — E039 Hypothyroidism, unspecified: Secondary | ICD-10-CM

## 2013-11-22 DIAGNOSIS — I10 Essential (primary) hypertension: Secondary | ICD-10-CM

## 2013-11-22 DIAGNOSIS — F411 Generalized anxiety disorder: Secondary | ICD-10-CM

## 2013-11-22 DIAGNOSIS — I6529 Occlusion and stenosis of unspecified carotid artery: Secondary | ICD-10-CM

## 2013-11-22 MED ORDER — HYDROCODONE-ACETAMINOPHEN 7.5-325 MG PO TABS: 0.5000 | ORAL_TABLET | Freq: Four times a day (QID) | ORAL | Status: DC | PRN

## 2013-11-22 NOTE — Progress Notes (Signed)
   Subjective:    Patient ID: Catherine Steele, female    DOB: 31-Aug-1918, 80 y.o.   MRN: [id number]  Hypertension This is a chronic problem. The current episode started more than 1 year ago. The problem has been waxing and waning since onset. The problem is controlled. Associated symptoms include anxiety. Pertinent negatives include no chest pain, headaches, palpitations, peripheral edema or shortness of breath. Risk factors for coronary artery disease include dyslipidemia, obesity, post-menopausal state and family history. Past treatments include calcium channel blockers, beta blockers and angiotensin blockers. The current treatment provides moderate improvement. Compliance problems include exercise.  Hypertensive end-organ damage includes a thyroid problem. There is no history of kidney disease, CAD/MI, CVA or heart failure.  Thyroid Problem Presents for follow-up visit. Symptoms include dry skin and hair loss. Patient reports no anxiety, constipation, depressed mood or palpitations. The symptoms have been stable. Past treatments include levothyroxine. The treatment provided significant relief. There is no history of heart failure.  Anxiety Presents for follow-up visit. Symptoms include nervous/anxious behavior. Patient reports no chest pain, depressed mood, excessive worry, insomnia, irritability, palpitations, panic or shortness of breath. Symptoms occur rarely. The severity of symptoms is mild. The quality of sleep is good.   Her past medical history is significant for anxiety/panic attacks. The treatment provided moderate relief. Compliance with prior treatments has been good.      Review of Systems  Constitutional: Negative.  Negative for irritability.  HENT: Negative.   Eyes: Negative.   Respiratory: Negative.  Negative for shortness of breath.   Cardiovascular: Negative.  Negative for chest pain and palpitations.  Gastrointestinal: Negative.  Negative for constipation.  Endocrine:  Negative.   Genitourinary: Negative.   Musculoskeletal: Negative.   Neurological: Negative.  Negative for headaches.  Hematological: Negative.   Psychiatric/Behavioral: The patient is nervous/anxious. The patient does not have insomnia.   All other systems reviewed and are negative.      Objective:   Physical Exam  Vitals reviewed. Constitutional: She is oriented to person, place, and time. She appears well-developed and well-nourished. No distress.  Neck: Normal range of motion. Neck supple. No thyromegaly present.  Cardiovascular: Normal rate, regular rhythm and intact distal pulses.   Murmur heard. Pulmonary/Chest: Effort normal. No respiratory distress. She has no wheezes.  Diminished breath sounds   Abdominal: Soft. Bowel sounds are normal. She exhibits no distension. There is no tenderness.  Musculoskeletal: Normal range of motion. She exhibits edema (1+ Bilateral legs). She exhibits no tenderness.  Neurological: She is alert and oriented to person, place, and time.  Skin: Skin is warm and dry.  Psychiatric: She has a normal mood and affect. Her behavior is normal. Judgment and thought content normal.     BP 144/59  Pulse 65  Temp(Src) 97.8 F (36.6 C) (Oral)  Ht $R'5\' 4"'nD$  (1.626 m)  Wt 183 lb (83.008 kg)  BMI 31.40 kg/m2      Assessment & Plan:  1. Essential hypertension - CMP14+EGFR  2. Unspecified hypothyroidism - Thyroid Panel With TSH  3. Unspecified vitamin D deficiency - Vit D  25 hydroxy (rtn osteoporosis monitoring)  4. Generalized anxiety disorder  5. Arthritis - HYDROcodone-acetaminophen (NORCO) 7.5-325 MG per tablet; Take 0.5 tablets by mouth every 6 (six) hours as needed.  Dispense: 60 tablet; Refill: 0   Continue all meds Labs pending Health Maintenance reviewed Diet and exercise encouraged RTO 3 months  Evelina Dun, FNP

## 2013-11-22 NOTE — Patient Instructions (Signed)

## 2013-11-23 ENCOUNTER — Other Ambulatory Visit: Payer: Self-pay | Admitting: Family

## 2013-11-23 LAB — THYROID PANEL WITH TSH
Free Thyroxine Index: 2.1 (ref 1.2–4.9)
T3 Uptake Ratio: 26 % (ref 24–39)
T4 TOTAL: 8 ug/dL (ref 4.5–12.0)
TSH: 6.27 u[IU]/mL — ABNORMAL HIGH (ref 0.450–4.500)

## 2013-11-23 LAB — CMP14+EGFR
ALK PHOS: 68 IU/L (ref 39–117)
ALT: 12 IU/L (ref 0–32)
AST: 14 IU/L (ref 0–40)
Albumin/Globulin Ratio: 1.9 (ref 1.1–2.5)
Albumin: 3.8 g/dL (ref 3.2–4.6)
BUN / CREAT RATIO: 15 (ref 11–26)
BUN: 14 mg/dL (ref 10–36)
CALCIUM: 9.4 mg/dL (ref 8.7–10.3)
CHLORIDE: 100 mmol/L (ref 97–108)
CO2: 29 mmol/L (ref 18–29)
Creatinine, Ser: 0.93 mg/dL (ref 0.57–1.00)
GFR calc Af Amer: 61 mL/min/{1.73_m2} (ref >59)
GFR calc non Af Amer: 53 mL/min/{1.73_m2} — ABNORMAL LOW (ref >59)
Globulin, Total: 2 g/dL (ref 1.5–4.5)
Glucose: 106 mg/dL — ABNORMAL HIGH (ref 65–99)
Potassium: 4.4 mmol/L (ref 3.5–5.2)
Sodium: 140 mmol/L (ref 134–144)
Total Bilirubin: 0.3 mg/dL (ref 0.0–1.2)
Total Protein: 5.8 g/dL — ABNORMAL LOW (ref 6.0–8.5)

## 2013-11-23 LAB — VITAMIN D 25 HYDROXY (VIT D DEFICIENCY, FRACTURES): Vit D, 25-Hydroxy: 38.5 ng/mL (ref 30.0–100.0)

## 2013-11-23 MED ORDER — LEVOTHYROXINE SODIUM 75 MCG PO TABS: 75.0000 ug | ORAL_TABLET | Freq: Every day | ORAL | Status: DC

## 2013-11-30 ENCOUNTER — Encounter: Payer: Self-pay | Admitting: *Deleted

## 2014-01-04 ENCOUNTER — Telehealth: Payer: Self-pay | Admitting: Family

## 2014-01-04 DIAGNOSIS — M1990 Unspecified osteoarthritis, unspecified site: Secondary | ICD-10-CM

## 2014-01-05 MED ORDER — HYDROCODONE-ACETAMINOPHEN 7.5-325 MG PO TABS: 0.5000 | ORAL_TABLET | Freq: Four times a day (QID) | ORAL | Status: DC | PRN

## 2014-01-05 NOTE — Telephone Encounter (Signed)
Aware,rx ready. 

## 2014-02-09 ENCOUNTER — Other Ambulatory Visit: Payer: Self-pay | Admitting: *Deleted

## 2014-02-09 MED ORDER — CLONIDINE HCL 0.1 MG PO TABS: 0.1000 mg | ORAL_TABLET | Freq: Three times a day (TID) | ORAL | Status: DC

## 2014-02-09 MED ORDER — LOSARTAN POTASSIUM 25 MG PO TABS: 25.0000 mg | ORAL_TABLET | Freq: Every day | ORAL | Status: DC

## 2014-02-15 ENCOUNTER — Telehealth: Payer: Self-pay | Admitting: Family

## 2014-02-15 DIAGNOSIS — M1990 Unspecified osteoarthritis, unspecified site: Secondary | ICD-10-CM

## 2014-02-16 MED ORDER — HYDROCODONE-ACETAMINOPHEN 7.5-325 MG PO TABS: 0.5000 | ORAL_TABLET | Freq: Four times a day (QID) | ORAL | Status: DC | PRN

## 2014-02-16 NOTE — Telephone Encounter (Signed)
Rx is up front no answer

## 2014-02-23 ENCOUNTER — Encounter: Payer: Self-pay | Admitting: Family

## 2014-02-23 ENCOUNTER — Ambulatory Visit (INDEPENDENT_AMBULATORY_CARE_PROVIDER_SITE_OTHER): Payer: Medicare Other | Admitting: Family

## 2014-02-23 VITALS — BP 182/73 | HR 68 | Temp 97.2°F | Ht 64.0 in | Wt 174.6 lb

## 2014-02-23 DIAGNOSIS — J302 Other seasonal allergic rhinitis: Secondary | ICD-10-CM

## 2014-02-23 DIAGNOSIS — E559 Vitamin D deficiency, unspecified: Secondary | ICD-10-CM

## 2014-02-23 DIAGNOSIS — M1990 Unspecified osteoarthritis, unspecified site: Secondary | ICD-10-CM

## 2014-02-23 DIAGNOSIS — F411 Generalized anxiety disorder: Secondary | ICD-10-CM

## 2014-02-23 DIAGNOSIS — E039 Hypothyroidism, unspecified: Secondary | ICD-10-CM

## 2014-02-23 DIAGNOSIS — I10 Essential (primary) hypertension: Secondary | ICD-10-CM

## 2014-02-23 DIAGNOSIS — I6529 Occlusion and stenosis of unspecified carotid artery: Secondary | ICD-10-CM

## 2014-02-23 MED ORDER — HYDROCODONE-ACETAMINOPHEN 7.5-325 MG PO TABS: 1.0000 | ORAL_TABLET | Freq: Four times a day (QID) | ORAL | Status: DC | PRN

## 2014-02-23 MED ORDER — ALPRAZOLAM 0.25 MG PO TABS: 0.2500 mg | ORAL_TABLET | Freq: Every evening | ORAL | Status: DC | PRN

## 2014-02-23 MED ORDER — FLUTICASONE PROPIONATE 50 MCG/ACT NA SUSP: 2.0000 | Freq: Every day | NASAL | Status: AC

## 2014-02-23 NOTE — Progress Notes (Signed)
Subjective:    Patient ID: Catherine Steele, female    DOB: Sep 17, 1918, 80 y.o.   MRN: [id number]  Hypertension This is a chronic problem. The current episode started more than 1 year ago. The problem has been waxing and waning since onset. The problem is uncontrolled. Associated symptoms include anxiety. Pertinent negatives include no chest pain, headaches, palpitations, peripheral edema or shortness of breath. Risk factors for coronary artery disease include dyslipidemia, obesity, post-menopausal state and family history. Past treatments include calcium channel blockers, beta blockers and angiotensin blockers. The current treatment provides moderate improvement. Compliance problems include exercise.  Hypertensive end-organ damage includes a thyroid problem. There is no history of kidney disease, CAD/MI, CVA or heart failure.  Thyroid Problem Presents for follow-up visit. Symptoms include dry skin, hair loss and heat intolerance. Patient reports no anxiety, constipation, depressed mood or palpitations. The symptoms have been stable. Past treatments include levothyroxine. The treatment provided significant relief. There is no history of heart failure.  Anxiety Presents for follow-up visit. Symptoms include nervous/anxious behavior. Patient reports no chest pain, depressed mood, excessive worry, insomnia, irritability, palpitations, panic or shortness of breath. Symptoms occur rarely. The severity of symptoms is mild. The quality of sleep is good.   Her past medical history is significant for anxiety/panic attacks. Past treatments include benzodiazephines. The treatment provided moderate relief. Compliance with prior treatments has been good.   *Pt take BP daily and brought in blood pressure log- Avg is 140/60's   Review of Systems  Constitutional: Negative.  Negative for irritability.  HENT: Negative.   Eyes: Negative.   Respiratory: Negative.  Negative for shortness of breath.     Cardiovascular: Negative.  Negative for chest pain and palpitations.  Gastrointestinal: Negative.  Negative for constipation.  Endocrine: Positive for heat intolerance.  Genitourinary: Negative.   Musculoskeletal: Negative.   Neurological: Negative.  Negative for headaches.  Hematological: Negative.   Psychiatric/Behavioral: The patient is nervous/anxious. The patient does not have insomnia.   All other systems reviewed and are negative.      Objective:   Physical Exam  Vitals reviewed. Constitutional: She is oriented to person, place, and time. She appears well-developed and well-nourished. No distress.  HENT:  Head: Normocephalic and atraumatic.  Right Ear: External ear normal.  Left Ear: External ear normal.  Nasal passage erythemas with mild swelling   Eyes: Pupils are equal, round, and reactive to light.  Neck: Normal range of motion. Neck supple. No thyromegaly present.  Cardiovascular: Normal rate, regular rhythm and intact distal pulses.   Murmur heard. Pulmonary/Chest: Effort normal. No respiratory distress. She has no wheezes.  Diminished breath sounds bilaterally   Abdominal: Soft. Bowel sounds are normal. She exhibits no distension. There is no tenderness.  Musculoskeletal: Normal range of motion. She exhibits edema (Trace amt in BLE). She exhibits no tenderness.  Neurological: She is alert and oriented to person, place, and time. She has normal reflexes. No cranial nerve deficit.  Skin: Skin is warm and dry.  Psychiatric: She has a normal mood and affect. Her behavior is normal. Judgment and thought content normal.   BP 182/73  Pulse 68  Temp(Src) 97.2 F (36.2 C) (Oral)  Ht $R'5\' 4"'Ia$  (1.626 m)  Wt 174 lb 9.6 oz (79.198 kg)  BMI 29.96 kg/m2        Assessment & Plan:  1. Essential hypertension - CMP14+EGFR  2. Hypothyroidism, unspecified hypothyroidism type - CMP14+EGFR - Thyroid Panel With TSH  3. Vitamin D deficiency -  CMP14+EGFR - Vit D  25  hydroxy (rtn osteoporosis monitoring)  4. Generalized anxiety disorder - CMP14+EGFR - ALPRAZolam (XANAX) 0.25 MG tablet; Take 1 tablet (0.25 mg total) by mouth at bedtime as needed for sleep.  Dispense: 30 tablet; Refill: 3  5. Other seasonal allergic rhinitis  - fluticasone (FLONASE) 50 MCG/ACT nasal spray; Place 2 sprays into both nostrils daily.  Dispense: 16 g; Refill: 6  6. Arthritis - HYDROcodone-acetaminophen ([place]) 7.5-325 MG per tablet; Take 1 tablet by mouth every 6 (six) hours as needed.  Dispense: 60 tablet; Refill: 0 -Pain medication increased to one tablet q 6 hours instead of half- For arthritis pain and tooth pain  Continue all meds Labs pending Health Maintenance reviewed- Pt refused flu shot today Diet and exercise encouraged RTO 4 months  Evelina Dun, FNP

## 2014-02-23 NOTE — Patient Instructions (Signed)
Hypothyroidism The thyroid is a large gland located in the lower front of your neck. The thyroid gland helps control metabolism. Metabolism is how your body handles food. It controls metabolism with the hormone thyroxine. When this gland is underactive (hypothyroid), it produces too little hormone.  CAUSES These include:   Absence or destruction of thyroid tissue.  Goiter due to iodine deficiency.  Goiter due to medications.  Congenital defects (since birth).  Problems with the pituitary. This causes a lack of TSH (thyroid stimulating hormone). This hormone tells the thyroid to turn out more hormone. SYMPTOMS  Lethargy (feeling as though you have no energy)  Cold intolerance  Weight gain (in spite of normal food intake)  Dry skin  Coarse hair  Menstrual irregularity (if severe, may lead to infertility)  Slowing of thought processes Cardiac problems are also caused by insufficient amounts of thyroid hormone. Hypothyroidism in the newborn is cretinism, and is an extreme form. It is important that this form be treated adequately and immediately or it will lead rapidly to retarded physical and mental development. DIAGNOSIS  To prove hypothyroidism, your caregiver may do blood tests and ultrasound tests. Sometimes the signs are hidden. It may be necessary for your caregiver to watch this illness with blood tests either before or after diagnosis and treatment. TREATMENT  Low levels of thyroid hormone are increased by using synthetic thyroid hormone. This is a safe, effective treatment. It usually takes about four weeks to gain the full effects of the medication. After you have the full effect of the medication, it will generally take another four weeks for problems to leave. Your caregiver may start you on low doses. If you have had heart problems the dose may be gradually increased. It is generally not an emergency to get rapidly to normal. HOME CARE INSTRUCTIONS   Take your  medications as your caregiver suggests. Let your caregiver know of any medications you are taking or start taking. Your caregiver will help you with dosage schedules.  As your condition improves, your dosage needs may increase. It will be necessary to have continuing blood tests as suggested by your caregiver.  Report all suspected medication side effects to your caregiver. SEEK MEDICAL CARE IF: Seek medical care if you develop:  Sweating.  Tremulousness (tremors).  Anxiety.  Rapid weight loss.  Heat intolerance.  Emotional swings.  Diarrhea.  Weakness. SEEK IMMEDIATE MEDICAL CARE IF:  You develop chest pain, an irregular heart beat (palpitations), or a rapid heart beat. MAKE SURE YOU:   Understand these instructions.  Will watch your condition.  Will get help right away if you are not doing well or get worse. Document Released: 05/04/2005 Document Revised: 07/27/2011 Document Reviewed: 12/23/2007 Exit[employer] Patient Information 2015 [employer]. This information is not intended to replace advice given to you by your health care provider. Make sure you discuss any questions you have with your health care provider.  

## 2014-02-24 LAB — CMP14+EGFR
A/G RATIO: 1.8 (ref 1.1–2.5)
ALT: 13 IU/L (ref 0–32)
AST: 20 IU/L (ref 0–40)
Albumin: 4.2 g/dL (ref 3.2–4.6)
Alkaline Phosphatase: 79 IU/L (ref 39–117)
BILIRUBIN TOTAL: 0.4 mg/dL (ref 0.0–1.2)
BUN/Creatinine Ratio: 14 (ref 11–26)
BUN: 14 mg/dL (ref 10–36)
CO2: 27 mmol/L (ref 18–29)
Calcium: 9.9 mg/dL (ref 8.7–10.3)
Chloride: 98 mmol/L (ref 97–108)
Creatinine, Ser: 1.02 mg/dL — ABNORMAL HIGH (ref 0.57–1.00)
GFR, EST AFRICAN AMERICAN: 55 mL/min/{1.73_m2} — AB (ref >59)
GFR, EST NON AFRICAN AMERICAN: 48 mL/min/{1.73_m2} — AB (ref >59)
Globulin, Total: 2.4 g/dL (ref 1.5–4.5)
Glucose: 97 mg/dL (ref 65–99)
POTASSIUM: 4.5 mmol/L (ref 3.5–5.2)
Sodium: 141 mmol/L (ref 134–144)
Total Protein: 6.6 g/dL (ref 6.0–8.5)

## 2014-02-24 LAB — THYROID PANEL WITH TSH
Free Thyroxine Index: 3.3 (ref 1.2–4.9)
T3 Uptake Ratio: 27 % (ref 24–39)
T4, Total: 12.2 ug/dL — ABNORMAL HIGH (ref 4.5–12.0)
TSH: 1.3 u[IU]/mL (ref 0.450–4.500)

## 2014-02-24 LAB — VITAMIN D 25 HYDROXY (VIT D DEFICIENCY, FRACTURES): Vit D, 25-Hydroxy: 44.1 ng/mL (ref 30.0–100.0)

## 2014-02-26 ENCOUNTER — Telehealth: Payer: Self-pay | Admitting: Family Medicine

## 2014-02-26 NOTE — Telephone Encounter (Signed)
Message copied by Doreatha MassedMOORE, MITZI on Mon Feb 26, 2014  2:52 PM ------      Message from: MaramecHAWKS, [place] A      Created: Mon Feb 26, 2014 11:23 AM       Kidney and liver function stable      Thyroid levels WNl      Vit D levels WNL ------

## 2014-03-02 NOTE — Telephone Encounter (Signed)
PATIENT AWARE

## 2014-04-18 ENCOUNTER — Other Ambulatory Visit: Payer: Self-pay | Admitting: Family Medicine

## 2014-04-18 DIAGNOSIS — M1990 Unspecified osteoarthritis, unspecified site: Secondary | ICD-10-CM

## 2014-04-18 MED ORDER — HYDROCODONE-ACETAMINOPHEN 7.5-325 MG PO TABS: 1.0000 | ORAL_TABLET | Freq: Four times a day (QID) | ORAL | Status: DC | PRN

## 2014-04-18 NOTE — Telephone Encounter (Signed)
Catherine Steele's patient. Last filled and seen 02/23/14. Rx will print

## 2014-06-06 ENCOUNTER — Other Ambulatory Visit: Payer: Self-pay | Admitting: Family

## 2014-06-06 DIAGNOSIS — M1990 Unspecified osteoarthritis, unspecified site: Secondary | ICD-10-CM

## 2014-06-06 MED ORDER — HYDROCODONE-ACETAMINOPHEN 7.5-325 MG PO TABS: 1.0000 | ORAL_TABLET | Freq: Four times a day (QID) | ORAL | Status: DC | PRN

## 2014-06-06 NOTE — Telephone Encounter (Signed)
No answer.  Hydrocodone script is ready.

## 2014-06-07 ENCOUNTER — Other Ambulatory Visit: Payer: Self-pay | Admitting: Family

## 2014-06-13 NOTE — Telephone Encounter (Signed)
Prescription was picked up on 06/06/2014

## 2014-06-29 ENCOUNTER — Encounter: Payer: Self-pay | Admitting: Family

## 2014-06-29 ENCOUNTER — Ambulatory Visit (INDEPENDENT_AMBULATORY_CARE_PROVIDER_SITE_OTHER): Payer: Medicare Other | Admitting: Family

## 2014-06-29 VITALS — BP 160/70 | HR 65 | Resp 16

## 2014-06-29 DIAGNOSIS — I10 Essential (primary) hypertension: Secondary | ICD-10-CM

## 2014-06-29 DIAGNOSIS — F411 Generalized anxiety disorder: Secondary | ICD-10-CM

## 2014-06-29 DIAGNOSIS — M1990 Unspecified osteoarthritis, unspecified site: Secondary | ICD-10-CM

## 2014-06-29 DIAGNOSIS — E039 Hypothyroidism, unspecified: Secondary | ICD-10-CM

## 2014-06-29 DIAGNOSIS — E559 Vitamin D deficiency, unspecified: Secondary | ICD-10-CM

## 2014-06-29 MED ORDER — HYDROCODONE-ACETAMINOPHEN 7.5-325 MG PO TABS: 1.0000 | ORAL_TABLET | Freq: Four times a day (QID) | ORAL | Status: DC | PRN

## 2014-06-29 NOTE — Progress Notes (Signed)
Subjective:    Patient ID: Catherine Steele, female    DOB: Oct 04, 1918, 81 y.o.   MRN: [id number]  Hypertension This is a chronic problem. The current episode started more than 1 year ago. The problem has been waxing and waning since onset. The problem is uncontrolled. Associated symptoms include anxiety. Pertinent negatives include no chest pain, headaches, palpitations, peripheral edema or shortness of breath. Risk factors for coronary artery disease include dyslipidemia, obesity, post-menopausal state and family history. Past treatments include calcium channel blockers, beta blockers and angiotensin blockers. The current treatment provides moderate improvement. Compliance problems include exercise.  Hypertensive end-organ damage includes a thyroid problem. There is no history of kidney disease, CAD/MI, CVA or heart failure.  Thyroid Problem Presents for follow-up visit. Symptoms include anxiety, dry skin and fatigue. Patient reports no constipation, depressed mood, diarrhea, palpitations or visual change. The symptoms have been stable. Past treatments include levothyroxine. The treatment provided significant relief. There is no history of heart failure.  Anxiety Presents for follow-up visit. Symptoms include nervous/anxious behavior. Patient reports no chest pain, depressed mood, excessive worry, insomnia, irritability, palpitations or shortness of breath. Symptoms occur rarely. The severity of symptoms is mild. The quality of sleep is good.   Her past medical history is significant for anxiety/panic attacks and depression.      Review of Systems  Constitutional: Positive for fatigue. Negative for irritability.  HENT: Negative.   Eyes: Negative.   Respiratory: Negative.  Negative for shortness of breath.   Cardiovascular: Negative.  Negative for chest pain and palpitations.  Gastrointestinal: Negative.  Negative for diarrhea and constipation.  Endocrine: Negative.   Genitourinary:  Negative.   Musculoskeletal: Negative.   Neurological: Negative.  Negative for headaches.  Hematological: Negative.   Psychiatric/Behavioral: The patient is nervous/anxious. The patient does not have insomnia.   All other systems reviewed and are negative.      Objective:   Physical Exam  Constitutional: She is oriented to person, place, and time. She appears well-developed and well-nourished. No distress.  HENT:  Head: Normocephalic and atraumatic.  Right Ear: External ear normal.  Left Ear: External ear normal.  Nose: Nose normal.  Mouth/Throat: Oropharynx is clear and moist.  Eyes: Pupils are equal, round, and reactive to light.  Neck: Normal range of motion. Neck supple. No thyromegaly present.  Cardiovascular: Normal rate, regular rhythm, normal heart sounds and intact distal pulses.   No murmur heard. Pulmonary/Chest: Effort normal and breath sounds normal. No respiratory distress. She has no wheezes.  Abdominal: Soft. Bowel sounds are normal. She exhibits no distension. There is no tenderness. There is no rebound.  Musculoskeletal: Normal range of motion. She exhibits no edema or tenderness.  Neurological: She is alert and oriented to person, place, and time. She has normal reflexes. No cranial nerve deficit.  Skin: Skin is warm and dry.  Psychiatric: She has a normal mood and affect. Her behavior is normal. Judgment and thought content normal.  Vitals reviewed.    BP 160/70 mmHg  Pulse 65      Assessment & Plan:  1. Essential hypertension -Pt takes her BP every day at home- Pt brought in log avg- 130's/60's-Pt does not want to adjust medications at this time - CMP14+EGFR  2. Hypothyroidism, unspecified hypothyroidism type - CMP14+EGFR - Thyroid Panel With TSH  3. Arthritis - CMP14+EGFR - HYDROcodone-acetaminophen (NORCO) 7.5-325 MG per tablet; Take 1 tablet by mouth every 6 (six) hours as needed.  Dispense: 60 tablet; Refill: 0  4. Vitamin D deficiency -  CMP14+EGFR - Vit D  25 hydroxy (rtn osteoporosis monitoring)  5. Generalized anxiety disorder - CMP14+EGFR   Continue all meds Labs pending Health Maintenance reviewed Diet and exercise encouraged RTO 6 month   Evelina Dun, FNP

## 2014-06-29 NOTE — Patient Instructions (Signed)

## 2014-06-30 LAB — CMP14+EGFR
A/G RATIO: 1.6 (ref 1.1–2.5)
ALBUMIN: 4.1 g/dL (ref 3.2–4.6)
ALK PHOS: 79 IU/L (ref 39–117)
ALT: 10 IU/L (ref 0–32)
AST: 16 IU/L (ref 0–40)
BUN / CREAT RATIO: 16 (ref 11–26)
BUN: 15 mg/dL (ref 10–36)
Bilirubin Total: 0.4 mg/dL (ref 0.0–1.2)
CO2: 28 mmol/L (ref 18–29)
Calcium: 10.2 mg/dL (ref 8.7–10.3)
Chloride: 100 mmol/L (ref 97–108)
Creatinine, Ser: 0.96 mg/dL (ref 0.57–1.00)
GFR calc Af Amer: 59 mL/min/{1.73_m2} — ABNORMAL LOW (ref >59)
GFR calc non Af Amer: 51 mL/min/{1.73_m2} — ABNORMAL LOW (ref >59)
Globulin, Total: 2.5 g/dL (ref 1.5–4.5)
Glucose: 103 mg/dL — ABNORMAL HIGH (ref 65–99)
Potassium: 5.2 mmol/L (ref 3.5–5.2)
SODIUM: 142 mmol/L (ref 134–144)
Total Protein: 6.6 g/dL (ref 6.0–8.5)

## 2014-06-30 LAB — VITAMIN D 25 HYDROXY (VIT D DEFICIENCY, FRACTURES): VIT D 25 HYDROXY: 49.6 ng/mL (ref 30.0–100.0)

## 2014-06-30 LAB — THYROID PANEL WITH TSH
Free Thyroxine Index: 2.9 (ref 1.2–4.9)
T3 Uptake Ratio: 26 % (ref 24–39)
T4, Total: 11.1 ug/dL (ref 4.5–12.0)
TSH: 0.997 u[IU]/mL (ref 0.450–4.500)

## 2014-08-06 ENCOUNTER — Other Ambulatory Visit: Payer: Self-pay | Admitting: *Deleted

## 2014-08-06 MED ORDER — AMLODIPINE BESYLATE 5 MG PO TABS: ORAL_TABLET | ORAL | Status: DC

## 2014-08-14 ENCOUNTER — Other Ambulatory Visit: Payer: Self-pay | Admitting: *Deleted

## 2014-08-14 DIAGNOSIS — I10 Essential (primary) hypertension: Secondary | ICD-10-CM

## 2014-08-14 MED ORDER — ATENOLOL 50 MG PO TABS: ORAL_TABLET | ORAL | Status: AC

## 2014-08-15 ENCOUNTER — Other Ambulatory Visit: Payer: Self-pay | Admitting: Nurse Practitioner

## 2014-08-20 ENCOUNTER — Other Ambulatory Visit: Payer: Self-pay | Admitting: *Deleted

## 2014-08-20 MED ORDER — AMLODIPINE BESYLATE 5 MG PO TABS: ORAL_TABLET | ORAL | Status: AC

## 2014-08-26 ENCOUNTER — Other Ambulatory Visit: Payer: Self-pay | Admitting: Nurse Practitioner

## 2014-08-28 ENCOUNTER — Other Ambulatory Visit: Payer: Self-pay | Admitting: Family

## 2014-08-28 DIAGNOSIS — M1990 Unspecified osteoarthritis, unspecified site: Secondary | ICD-10-CM

## 2014-08-28 MED ORDER — HYDROCODONE-ACETAMINOPHEN 7.5-325 MG PO TABS: 1.0000 | ORAL_TABLET | Freq: Four times a day (QID) | ORAL | Status: DC | PRN

## 2014-08-28 NOTE — Telephone Encounter (Signed)
RX ready for pick up 

## 2014-08-28 NOTE — Telephone Encounter (Signed)
Patient aware rx ready to be picked up 

## 2014-09-07 ENCOUNTER — Other Ambulatory Visit: Payer: Self-pay | Admitting: Nurse Practitioner

## 2014-10-05 ENCOUNTER — Telehealth: Payer: Self-pay | Admitting: Family

## 2014-10-05 DIAGNOSIS — F411 Generalized anxiety disorder: Secondary | ICD-10-CM

## 2014-10-05 DIAGNOSIS — M1990 Unspecified osteoarthritis, unspecified site: Secondary | ICD-10-CM

## 2014-10-05 MED ORDER — ALPRAZOLAM 0.25 MG PO TABS: 0.2500 mg | ORAL_TABLET | Freq: Every evening | ORAL | Status: DC | PRN

## 2014-10-05 MED ORDER — HYDROCODONE-ACETAMINOPHEN 7.5-325 MG PO TABS: 1.0000 | ORAL_TABLET | Freq: Four times a day (QID) | ORAL | Status: DC | PRN

## 2014-10-05 NOTE — Telephone Encounter (Signed)
lmovm that written Rx is at front desk ready for pickup 

## 2014-10-05 NOTE — Telephone Encounter (Signed)
RX ready for pick up 

## 2014-11-09 ENCOUNTER — Telehealth: Payer: Self-pay | Admitting: Family

## 2014-11-09 DIAGNOSIS — M1990 Unspecified osteoarthritis, unspecified site: Secondary | ICD-10-CM

## 2014-11-09 MED ORDER — NYSTATIN 100000 UNIT/GM EX OINT: TOPICAL_OINTMENT | CUTANEOUS | Status: DC

## 2014-11-09 MED ORDER — HYDROCODONE-ACETAMINOPHEN 7.5-325 MG PO TABS: 1.0000 | ORAL_TABLET | Freq: Four times a day (QID) | ORAL | Status: DC | PRN

## 2014-11-09 NOTE — Telephone Encounter (Signed)
RX ready for pick up 

## 2014-11-09 NOTE — Telephone Encounter (Signed)
Aware that script is ready to be picked up with photo ID

## 2014-11-14 NOTE — Telephone Encounter (Signed)
Done. Pt is seeing C.Hawks now.

## 2014-11-27 ENCOUNTER — Other Ambulatory Visit: Payer: Self-pay | Admitting: Family

## 2014-12-11 ENCOUNTER — Telehealth: Payer: Self-pay | Admitting: Family Medicine

## 2014-12-13 ENCOUNTER — Other Ambulatory Visit: Payer: Self-pay | Admitting: Family

## 2014-12-13 DIAGNOSIS — R350 Frequency of micturition: Secondary | ICD-10-CM

## 2014-12-13 NOTE — Telephone Encounter (Signed)
Pt is now going to [facility]

## 2014-12-13 NOTE — Telephone Encounter (Signed)
Patient can send in a urine specimen for lab to analyze Per provider.

## 2014-12-14 ENCOUNTER — Other Ambulatory Visit: Payer: Medicare Other

## 2014-12-14 DIAGNOSIS — R350 Frequency of micturition: Secondary | ICD-10-CM

## 2014-12-14 NOTE — Progress Notes (Signed)
Lab only 

## 2014-12-15 LAB — URINE CULTURE

## 2014-12-19 ENCOUNTER — Ambulatory Visit (INDEPENDENT_AMBULATORY_CARE_PROVIDER_SITE_OTHER): Payer: Medicare Other | Admitting: *Deleted

## 2014-12-19 DIAGNOSIS — Z111 Encounter for screening for respiratory tuberculosis: Secondary | ICD-10-CM | POA: Diagnosis not present

## 2014-12-21 ENCOUNTER — Encounter: Payer: Self-pay | Admitting: *Deleted

## 2014-12-21 ENCOUNTER — Ambulatory Visit: Payer: Medicare Other | Admitting: *Deleted

## 2014-12-21 LAB — TB SKIN TEST
Induration: 0 mm
TB Skin Test: NEGATIVE

## 2014-12-24 ENCOUNTER — Telehealth: Payer: Self-pay | Admitting: Family

## 2014-12-24 ENCOUNTER — Telehealth: Payer: Self-pay | Admitting: Nurse Practitioner

## 2014-12-24 MED ORDER — ALPRAZOLAM 0.5 MG PO TABS: 0.5000 mg | ORAL_TABLET | Freq: Two times a day (BID) | ORAL | Status: DC | PRN

## 2014-12-24 NOTE — Telephone Encounter (Signed)
Kathie Rhodes aware of results.

## 2014-12-24 NOTE — Telephone Encounter (Signed)
Rx faxed to [facility] at [phone]

## 2014-12-24 NOTE — Telephone Encounter (Signed)
She is at [facility] now, she is getting combative with staff, may they have a prn order for anxiety.

## 2014-12-24 NOTE — Telephone Encounter (Signed)
Xanax 0.5 mg BID prn for restlessness or agitation

## 2014-12-28 ENCOUNTER — Ambulatory Visit (INDEPENDENT_AMBULATORY_CARE_PROVIDER_SITE_OTHER): Payer: Medicare Other | Admitting: Family

## 2014-12-28 ENCOUNTER — Encounter: Payer: Self-pay | Admitting: Family

## 2014-12-28 VITALS — BP 153/65 | HR 59 | Temp 97.7°F | Ht 62.0 in | Wt 151.0 lb

## 2014-12-28 DIAGNOSIS — E559 Vitamin D deficiency, unspecified: Secondary | ICD-10-CM | POA: Diagnosis not present

## 2014-12-28 DIAGNOSIS — F411 Generalized anxiety disorder: Secondary | ICD-10-CM | POA: Diagnosis not present

## 2014-12-28 DIAGNOSIS — Z23 Encounter for immunization: Secondary | ICD-10-CM | POA: Diagnosis not present

## 2014-12-28 DIAGNOSIS — E039 Hypothyroidism, unspecified: Secondary | ICD-10-CM | POA: Diagnosis not present

## 2014-12-28 DIAGNOSIS — M1990 Unspecified osteoarthritis, unspecified site: Secondary | ICD-10-CM

## 2014-12-28 DIAGNOSIS — I10 Essential (primary) hypertension: Secondary | ICD-10-CM

## 2014-12-28 NOTE — Addendum Note (Signed)
Addended by: Almeta Monas on: 12/28/2014 11:53 AM   Modules accepted: Orders

## 2014-12-28 NOTE — Patient Instructions (Signed)
Generalized Anxiety Disorder Generalized anxiety disorder (GAD) is a mental disorder. It interferes with life functions, including relationships, work, and school. GAD is different from normal anxiety, which everyone experiences at some point in their lives in response to specific life events and activities. Normal anxiety actually helps us prepare for and get through these life events and activities. Normal anxiety goes away after the event or activity is over.  GAD causes anxiety that is not necessarily related to specific events or activities. It also causes excess anxiety in proportion to specific events or activities. The anxiety associated with GAD is also difficult to control. GAD can vary from mild to severe. People with severe GAD can have intense waves of anxiety with physical symptoms (panic attacks).  SYMPTOMS The anxiety and worry associated with GAD are difficult to control. This anxiety and worry are related to many life events and activities and also occur more days than not for 6 months or longer. People with GAD also have three or more of the following symptoms (one or more in children):  Restlessness.   Fatigue.  Difficulty concentrating.   Irritability.  Muscle tension.  Difficulty sleeping or unsatisfying sleep. DIAGNOSIS GAD is diagnosed through an assessment by your health care provider. Your health care provider will ask you questions aboutyour mood,physical symptoms, and events in your life. Your health care provider may ask you about your medical history and use of alcohol or drugs, including prescription medicines. Your health care provider may also do a physical exam and blood tests. Certain medical conditions and the use of certain substances can cause symptoms similar to those associated with GAD. Your health care provider may refer you to a mental health specialist for further evaluation. TREATMENT The following therapies are usually used to treat GAD:    Medication. Antidepressant medication usually is prescribed for long-term daily control. Antianxiety medicines may be added in severe cases, especially when panic attacks occur.   Talk therapy (psychotherapy). Certain types of talk therapy can be helpful in treating GAD by providing support, education, and guidance. A form of talk therapy called cognitive behavioral therapy can teach you healthy ways to think about and react to daily life events and activities.  Stress managementtechniques. These include yoga, meditation, and exercise and can be very helpful when they are practiced regularly. A mental health specialist can help determine which treatment is best for you. Some people see improvement with one therapy. However, other people require a combination of therapies. Document Released: 08/29/2012 Document Revised: 09/18/2013 Document Reviewed: 08/29/2012 ExitCare Patient Information 2015 [employer]. This information is not intended to replace advice given to you by your health care provider. Make sure you discuss any questions you have with your health care provider.  

## 2014-12-28 NOTE — Progress Notes (Signed)
Subjective:    Patient ID: Catherine Steele, female    DOB: 1918/08/17, 81 y.o.   MRN: [id number]  Pt presents to the office today for chronic follow up. Pt is complaining of seeing a bright light at times. Pt is a resident at [facility] as of August 5. Pt is A&O X 4.  Hypertension This is a chronic problem. The current episode started more than 1 year ago. The problem has been waxing and waning since onset. The problem is uncontrolled. Associated symptoms include anxiety and peripheral edema. Pertinent negatives include no headaches, palpitations or shortness of breath. Risk factors for coronary artery disease include dyslipidemia, obesity, post-menopausal state and family history. Past treatments include calcium channel blockers, beta blockers and angiotensin blockers. The current treatment provides moderate improvement. Compliance problems include exercise.  Hypertensive end-organ damage includes a thyroid problem. There is no history of kidney disease, CAD/MI, CVA or heart failure.  Thyroid Problem Presents for follow-up visit. Symptoms include anxiety, dry skin and fatigue. Patient reports no constipation, depressed mood, diarrhea, palpitations or visual change. The symptoms have been stable. Past treatments include levothyroxine. The treatment provided significant relief. There is no history of heart failure.  Anxiety Presents for follow-up visit. Onset was 1 to 6 months ago. The problem has been waxing and waning. Symptoms include nervous/anxious behavior. Patient reports no depressed mood, excessive worry, insomnia, irritability, palpitations or shortness of breath. Symptoms occur rarely. The severity of symptoms is moderate. The quality of sleep is good.   Her past medical history is significant for anxiety/panic attacks and depression. Past treatments include benzodiazephines.      Review of Systems  Constitutional: Positive for fatigue. Negative for irritability.  HENT: Negative.     Eyes: Negative.   Respiratory: Negative.  Negative for shortness of breath.   Cardiovascular: Negative.  Negative for palpitations.  Gastrointestinal: Negative.  Negative for diarrhea and constipation.  Endocrine: Negative.   Genitourinary: Negative.   Musculoskeletal: Negative.   Neurological: Negative.  Negative for headaches.  Hematological: Negative.   Psychiatric/Behavioral: The patient is nervous/anxious. The patient does not have insomnia.   All other systems reviewed and are negative.      Objective:   Physical Exam  Constitutional: She is oriented to person, place, and time. She appears well-developed and well-nourished. No distress.  HENT:  Head: Normocephalic and atraumatic.  Right Ear: External ear normal.  Left Ear: External ear normal.  Nose: Nose normal.  Mouth/Throat: Oropharynx is clear and moist.  Eyes: Pupils are equal, round, and reactive to light.  Neck: Normal range of motion. Neck supple. No thyromegaly present.  Cardiovascular: Normal rate, regular rhythm and intact distal pulses.   Murmur heard. Pulmonary/Chest: Effort normal and breath sounds normal. No respiratory distress. She has no wheezes.  Abdominal: Soft. Bowel sounds are normal. She exhibits no distension. There is no tenderness.  Musculoskeletal: Normal range of motion. She exhibits no edema or tenderness.  Neurological: She is alert and oriented to person, place, and time. She has normal reflexes. No cranial nerve deficit.  Skin: Skin is warm and dry. Ecchymosis (scattered on arm) noted.  BLE excessive dry skin   Psychiatric: She has a normal mood and affect. Her behavior is normal. Judgment and thought content normal.  Vitals reviewed.   BP 153/65 mmHg  Pulse 59  Temp(Src) 97.7 F (36.5 C) (Oral)  Ht _0  (1.575 m)  Wt 151 lb (68.493 kg)  BMI 27.61 kg/m2  Assessment & Plan:  1. Essential hypertension - CMP14+EGFR  2. Hypothyroidism, unspecified hypothyroidism type -  CMP14+EGFR - Thyroid Panel With TSH  3. Arthritis - CMP14+EGFR  4. Vitamin D deficiency - CMP14+EGFR - Vit D  25 hydroxy (rtn osteoporosis monitoring)  5. Generalized anxiety disorder - CMP14+EGFR   Continue all meds Labs pending Health Maintenance reviewed Diet and exercise encouraged RTO 6 months  Evelina Dun, FNP

## 2014-12-29 LAB — CMP14+EGFR
A/G RATIO: 1.8 (ref 1.1–2.5)
ALK PHOS: 76 IU/L (ref 39–117)
ALT: 17 IU/L (ref 0–32)
AST: 16 IU/L (ref 0–40)
Albumin: 3.6 g/dL (ref 3.2–4.6)
BUN / CREAT RATIO: 19 (ref 11–26)
BUN: 20 mg/dL (ref 10–36)
Bilirubin Total: 0.5 mg/dL (ref 0.0–1.2)
CO2: 32 mmol/L — ABNORMAL HIGH (ref 18–29)
Calcium: 9.8 mg/dL (ref 8.7–10.3)
Chloride: 98 mmol/L (ref 97–108)
Creatinine, Ser: 1.03 mg/dL — ABNORMAL HIGH (ref 0.57–1.00)
GFR calc Af Amer: 54 mL/min/{1.73_m2} — ABNORMAL LOW (ref >59)
GFR calc non Af Amer: 47 mL/min/{1.73_m2} — ABNORMAL LOW (ref >59)
GLOBULIN, TOTAL: 2 g/dL (ref 1.5–4.5)
GLUCOSE: 88 mg/dL (ref 65–99)
Potassium: 3.8 mmol/L (ref 3.5–5.2)
Sodium: 144 mmol/L (ref 134–144)
Total Protein: 5.6 g/dL — ABNORMAL LOW (ref 6.0–8.5)

## 2014-12-29 LAB — THYROID PANEL WITH TSH
FREE THYROXINE INDEX: 3.2 (ref 1.2–4.9)
T3 Uptake Ratio: 31 % (ref 24–39)
T4, Total: 10.4 ug/dL (ref 4.5–12.0)
TSH: 2.33 u[IU]/mL (ref 0.450–4.500)

## 2014-12-29 LAB — VITAMIN D 25 HYDROXY (VIT D DEFICIENCY, FRACTURES): VIT D 25 HYDROXY: 51.7 ng/mL (ref 30.0–100.0)

## 2015-01-29 ENCOUNTER — Telehealth: Payer: Self-pay | Admitting: *Deleted

## 2015-01-29 NOTE — Telephone Encounter (Signed)
RX written out and given to nurse to fax

## 2015-01-29 NOTE — Telephone Encounter (Signed)
Please write an order for Calmoseptine to be applied after each bathroom visit. This needs to be faxed to [facility] at [phone]

## 2015-01-31 ENCOUNTER — Telehealth: Payer: Self-pay | Admitting: *Deleted

## 2015-01-31 NOTE — Telephone Encounter (Signed)
Pt needs to be seen. Could be UTI?

## 2015-01-31 NOTE — Telephone Encounter (Addendum)
Nicki called back and stated that patient refused to go to ER. They also stated that patient states that she is seeing demons in her bathroom, they catch her awake tapping on the window and count to 13 over and over. They say that she feels like people are messing with her medication.

## 2015-01-31 NOTE — Telephone Encounter (Signed)
Nicki from [facility] called and states that patient's O2 when they arrived was 54 and that they had a hard time awakening patient. When awake patients O2 was 94 but dropped back to 79 several times. They were advised to have patient transported to ER for evaluation.

## 2015-02-01 ENCOUNTER — Encounter: Payer: Self-pay | Admitting: Family

## 2015-02-01 ENCOUNTER — Inpatient Hospital Stay (HOSPITAL_COMMUNITY)
Admission: EM | Admit: 2015-02-01 | Discharge: 2015-02-04 | DRG: 689 | Disposition: A | Discharge status: Home Health | Payer: Medicare Other | Attending: Family Medicine | Admitting: Family Medicine

## 2015-02-01 ENCOUNTER — Emergency Department (HOSPITAL_COMMUNITY): Payer: Medicare Other

## 2015-02-01 ENCOUNTER — Ambulatory Visit (INDEPENDENT_AMBULATORY_CARE_PROVIDER_SITE_OTHER): Payer: Medicare Other | Admitting: Family

## 2015-02-01 ENCOUNTER — Encounter (HOSPITAL_COMMUNITY): Payer: Self-pay | Admitting: *Deleted

## 2015-02-01 VITALS — BP 85/53 | HR 64 | Temp 97.0°F | Ht 62.0 in | Wt 140.0 lb

## 2015-02-01 DIAGNOSIS — F0390 Unspecified dementia without behavioral disturbance: Secondary | ICD-10-CM | POA: Diagnosis present

## 2015-02-01 DIAGNOSIS — R627 Adult failure to thrive: Secondary | ICD-10-CM | POA: Diagnosis present

## 2015-02-01 DIAGNOSIS — R231 Pallor: Secondary | ICD-10-CM | POA: Diagnosis not present

## 2015-02-01 DIAGNOSIS — A419 Sepsis, unspecified organism: Secondary | ICD-10-CM | POA: Clinically undetermined

## 2015-02-01 DIAGNOSIS — E869 Volume depletion, unspecified: Secondary | ICD-10-CM | POA: Diagnosis present

## 2015-02-01 DIAGNOSIS — E861 Hypovolemia: Secondary | ICD-10-CM | POA: Diagnosis present

## 2015-02-01 DIAGNOSIS — Z66 Do not resuscitate: Secondary | ICD-10-CM | POA: Diagnosis present

## 2015-02-01 DIAGNOSIS — R443 Hallucinations, unspecified: Secondary | ICD-10-CM | POA: Diagnosis not present

## 2015-02-01 DIAGNOSIS — E039 Hypothyroidism, unspecified: Secondary | ICD-10-CM | POA: Diagnosis present

## 2015-02-01 DIAGNOSIS — E038 Other specified hypothyroidism: Secondary | ICD-10-CM | POA: Diagnosis not present

## 2015-02-01 DIAGNOSIS — E785 Hyperlipidemia, unspecified: Secondary | ICD-10-CM | POA: Diagnosis present

## 2015-02-01 DIAGNOSIS — I959 Hypotension, unspecified: Secondary | ICD-10-CM | POA: Diagnosis present

## 2015-02-01 DIAGNOSIS — E876 Hypokalemia: Secondary | ICD-10-CM | POA: Diagnosis present

## 2015-02-01 DIAGNOSIS — R410 Disorientation, unspecified: Secondary | ICD-10-CM | POA: Diagnosis not present

## 2015-02-01 DIAGNOSIS — E860 Dehydration: Secondary | ICD-10-CM | POA: Diagnosis present

## 2015-02-01 DIAGNOSIS — G9340 Encephalopathy, unspecified: Secondary | ICD-10-CM | POA: Diagnosis not present

## 2015-02-01 DIAGNOSIS — R7989 Other specified abnormal findings of blood chemistry: Secondary | ICD-10-CM

## 2015-02-01 DIAGNOSIS — I10 Essential (primary) hypertension: Secondary | ICD-10-CM | POA: Diagnosis not present

## 2015-02-01 DIAGNOSIS — N179 Acute kidney failure, unspecified: Secondary | ICD-10-CM | POA: Diagnosis present

## 2015-02-01 DIAGNOSIS — N390 Urinary tract infection, site not specified: Principal | ICD-10-CM | POA: Diagnosis present

## 2015-02-01 DIAGNOSIS — M1990 Unspecified osteoarthritis, unspecified site: Secondary | ICD-10-CM | POA: Diagnosis present

## 2015-02-01 DIAGNOSIS — R531 Weakness: Secondary | ICD-10-CM

## 2015-02-01 DIAGNOSIS — I950 Idiopathic hypotension: Secondary | ICD-10-CM | POA: Diagnosis not present

## 2015-02-01 LAB — URINE MICROSCOPIC-ADD ON

## 2015-02-01 LAB — URINALYSIS, ROUTINE W REFLEX MICROSCOPIC
GLUCOSE, UA: NEGATIVE mg/dL
Leukocytes, UA: NEGATIVE
Nitrite: NEGATIVE
PH: 6.5 (ref 5.0–8.0)
Specific Gravity, Urine: 1.015 (ref 1.005–1.030)
Urobilinogen, UA: 1 mg/dL (ref 0.0–1.0)

## 2015-02-01 LAB — CBC WITH DIFFERENTIAL/PLATELET
BASOS PCT: 0 %
Basophils Absolute: 0 10*3/uL (ref 0.0–0.1)
EOS ABS: 0.1 10*3/uL (ref 0.0–0.7)
Eosinophils Relative: 1 %
HCT: 44.9 % (ref 36.0–46.0)
Hemoglobin: 15 g/dL (ref 12.0–15.0)
LYMPHS ABS: 2.3 10*3/uL (ref 0.7–4.0)
Lymphocytes Relative: 19 %
MCH: 30.7 pg (ref 26.0–34.0)
MCHC: 33.4 g/dL (ref 30.0–36.0)
MCV: 92 fL (ref 78.0–100.0)
MONO ABS: 1.1 10*3/uL — AB (ref 0.1–1.0)
MONOS PCT: 9 %
Neutro Abs: 8.8 10*3/uL — ABNORMAL HIGH (ref 1.7–7.7)
Neutrophils Relative %: 71 %
Platelets: 304 10*3/uL (ref 150–400)
RBC: 4.88 MIL/uL (ref 3.87–5.11)
RDW: 13.8 % (ref 11.5–15.5)
WBC: 12.4 10*3/uL — ABNORMAL HIGH (ref 4.0–10.5)

## 2015-02-01 LAB — COMPREHENSIVE METABOLIC PANEL
ALBUMIN: 3.4 g/dL — AB (ref 3.5–5.0)
ALT: 13 U/L — ABNORMAL LOW (ref 14–54)
AST: 20 U/L (ref 15–41)
Alkaline Phosphatase: 95 U/L (ref 38–126)
Anion gap: 11 (ref 5–15)
BILIRUBIN TOTAL: 0.9 mg/dL (ref 0.3–1.2)
BUN: 22 mg/dL — AB (ref 6–20)
CHLORIDE: 95 mmol/L — AB (ref 101–111)
CO2: 33 mmol/L — AB (ref 22–32)
Calcium: 9.1 mg/dL (ref 8.9–10.3)
Creatinine, Ser: 1.37 mg/dL — ABNORMAL HIGH (ref 0.44–1.00)
GFR calc Af Amer: 37 mL/min — ABNORMAL LOW (ref >60)
GFR calc non Af Amer: 32 mL/min — ABNORMAL LOW (ref >60)
GLUCOSE: 136 mg/dL — AB (ref 65–99)
POTASSIUM: 2.7 mmol/L — AB (ref 3.5–5.1)
Sodium: 139 mmol/L (ref 135–145)
TOTAL PROTEIN: 6.3 g/dL — AB (ref 6.5–8.1)

## 2015-02-01 LAB — I-STAT CG4 LACTIC ACID, ED
LACTIC ACID, VENOUS: 2.57 mmol/L — AB (ref 0.5–2.0)
LACTIC ACID, VENOUS: 1.49 mmol/L (ref 0.5–2.0)

## 2015-02-01 LAB — MAGNESIUM: MAGNESIUM: 1.7 mg/dL (ref 1.7–2.4)

## 2015-02-01 LAB — TROPONIN I: TROPONIN I: 0.04 ng/mL — AB (ref <0.031)

## 2015-02-01 MED ORDER — POTASSIUM CHLORIDE 10 MEQ/100ML IV SOLN
10.0000 meq | INTRAVENOUS | Status: AC
  Administered 2015-02-02 (×3): 10 meq via INTRAVENOUS
  Filled 2015-02-01: qty 100

## 2015-02-01 MED ORDER — FLUTICASONE PROPIONATE 50 MCG/ACT NA SUSP
2.0000 | Freq: Every day | NASAL | Status: DC
  Administered 2015-02-02 – 2015-02-04 (×3): 2 via NASAL
  Filled 2015-02-01: qty 16

## 2015-02-01 MED ORDER — VANCOMYCIN HCL 500 MG IV SOLR
500.0000 mg | INTRAVENOUS | Status: DC
  Filled 2015-02-01 (×2): qty 500

## 2015-02-01 MED ORDER — SODIUM CHLORIDE 0.9 % IV BOLUS (SEPSIS): 1000.0000 mL | INTRAVENOUS | Status: AC

## 2015-02-01 MED ORDER — POTASSIUM CHLORIDE CRYS ER 20 MEQ PO TBCR
20.0000 meq | EXTENDED_RELEASE_TABLET | Freq: Once | ORAL | Status: AC
  Administered 2015-02-01: 20 meq via ORAL
  Filled 2015-02-01: qty 1

## 2015-02-01 MED ORDER — PIPERACILLIN-TAZOBACTAM 3.375 G IVPB 30 MIN
3.3750 g | Freq: Once | INTRAVENOUS | Status: AC
  Administered 2015-02-01: 3.375 g via INTRAVENOUS
  Filled 2015-02-01: qty 50

## 2015-02-01 MED ORDER — SODIUM CHLORIDE 0.9 % IV BOLUS (SEPSIS)
1000.0000 mL | Freq: Once | INTRAVENOUS | Status: AC
Start: 2015-02-01 — End: 2015-02-01
  Administered 2015-02-01: 1000 mL via INTRAVENOUS

## 2015-02-01 MED ORDER — SODIUM CHLORIDE 0.9 % IV SOLN
INTRAVENOUS | Status: AC
  Administered 2015-02-01: 22:00:00 via INTRAVENOUS

## 2015-02-01 MED ORDER — SODIUM CHLORIDE 0.9 % IJ SOLN
3.0000 mL | Freq: Two times a day (BID) | INTRAMUSCULAR | Status: DC
  Administered 2015-02-02 – 2015-02-04 (×5): 3 mL via INTRAVENOUS

## 2015-02-01 MED ORDER — VANCOMYCIN HCL IN DEXTROSE 1-5 GM/200ML-% IV SOLN
1000.0000 mg | Freq: Once | INTRAVENOUS | Status: AC
  Administered 2015-02-01: 1000 mg via INTRAVENOUS
  Filled 2015-02-01: qty 200

## 2015-02-01 MED ORDER — ENOXAPARIN SODIUM 30 MG/0.3ML ~~LOC~~ SOLN
30.0000 mg | SUBCUTANEOUS | Status: DC
  Administered 2015-02-02 – 2015-02-03 (×3): 30 mg via SUBCUTANEOUS
  Filled 2015-02-01 (×3): qty 0.3

## 2015-02-01 MED ORDER — POTASSIUM CHLORIDE 10 MEQ/100ML IV SOLN
10.0000 meq | Freq: Once | INTRAVENOUS | Status: AC
  Administered 2015-02-01: 10 meq via INTRAVENOUS
  Filled 2015-02-01: qty 100

## 2015-02-01 MED ORDER — LEVOTHYROXINE SODIUM 75 MCG PO TABS
75.0000 ug | ORAL_TABLET | Freq: Every day | ORAL | Status: DC
  Administered 2015-02-02 – 2015-02-04 (×3): 75 ug via ORAL
  Filled 2015-02-01 (×3): qty 1

## 2015-02-01 MED ORDER — ASPIRIN 325 MG PO TABS
325.0000 mg | ORAL_TABLET | Freq: Every day | ORAL | Status: DC
  Administered 2015-02-02 – 2015-02-04 (×3): 325 mg via ORAL
  Filled 2015-02-01 (×3): qty 1

## 2015-02-01 MED ORDER — PIPERACILLIN-TAZOBACTAM 3.375 G IVPB
3.3750 g | Freq: Three times a day (TID) | INTRAVENOUS | Status: DC
  Administered 2015-02-02: 3.375 g via INTRAVENOUS
  Filled 2015-02-01 (×8): qty 50

## 2015-02-01 NOTE — Progress Notes (Signed)
   Subjective:    Patient ID: Catherine Steele, female    DOB: 05-13-1919, 81 y.o.   MRN: [id number]  HPI Pt presents to the office today for hallucinations, hypotension. Pt is resident at [facility] and a caregiver is present. Care giver states she was complaining of seeing things. Care giver states a NA found the pt hard to arouse with a low O2 saturation. Pt states she is very tired and seem orientated at this time. Pt able to tell me her name, DOB, her address. Pt states it is July.   Review of Systems  Constitutional: Negative.   HENT: Negative.   Eyes: Negative.   Respiratory: Negative.  Negative for shortness of breath.   Cardiovascular: Negative.  Negative for palpitations.  Gastrointestinal: Negative.   Endocrine: Negative.   Genitourinary: Negative.   Musculoskeletal: Negative.   Neurological: Negative.  Negative for headaches.  Hematological: Negative.   Psychiatric/Behavioral: Negative.   All other systems reviewed and are negative.      Objective:   Physical Exam  Constitutional: She is oriented to person, place, and time. She appears lethargic. She has a sickly appearance. She appears ill. No distress.  HENT:  Head: Normocephalic and atraumatic.  Eyes: Pupils are equal, round, and reactive to light.  Neck: Normal range of motion. Neck supple. No thyromegaly present.  Cardiovascular: Normal rate and intact distal pulses.   No murmur heard. Pulmonary/Chest: Effort normal. She has no wheezes.  Diminished  Abdominal: Soft. Bowel sounds are normal.  Musculoskeletal: Normal range of motion. She exhibits no edema or tenderness.  Neurological: She is oriented to person, place, and time. She appears lethargic.  Skin: Skin is warm and dry. There is pallor.  Psychiatric: She has a normal mood and affect. Her behavior is normal. Judgment and thought content normal.  Vitals reviewed. BP 77/48 mmHg  Pulse 70  Temp(Src) 97 F (36.1 C) (Axillary)  Ht $R'5\' 2"'Bc$  (1.575 m)  Wt  140 lb (63.504 kg)  BMI 25.60 kg/m2  SpO2 94%       Assessment & Plan:  1. Hypotension, unspecified hypotension type - CBC with Differential/Platelet - CMP14+EGFR  2. Pale - CBC with Differential/Platelet - CMP14+EGFR  3. Hallucination - POCT urinalysis dipstick - POCT UA - Microscopic Only - CBC with Differential/Platelet - CMP14+EGFR  DNR orders given per pt and nephew, Artis Delay, [place] and health care proxy- [phone]  Pt unable to give urine sample- Not sure if this hypotension is related to infection or end of life? Pt states she is very tired and just "wants to get it over with". PT states she does want to go to ED and just lay down. Long discussion with nephew about end of life and decisions >35 mins of discussion face to face. PT and nephew both state they want her to be a DNR and to go "peacefully". EMS called and pt sent to [facility].   Evelina Dun, FNP

## 2015-02-01 NOTE — Patient Instructions (Signed)
Hypotension  As your heart beats, it forces blood through your arteries. This force is your blood pressure. If your blood pressure is too low for you to go about your normal activities or to support the organs of your body, you have hypotension. Hypotension is also referred to as low blood pressure. When your blood pressure becomes too low, you may not get enough blood to your brain. As a result, you may feel weak, feel lightheaded, or develop a rapid heart rate. In a more severe case, you may faint.  CAUSES  Various conditions can cause hypotension. These include:  · Blood loss.  · Dehydration.  · Heart or endocrine problems.  · Pregnancy.  · Severe infection.  · Not having a well-balanced diet filled with needed nutrients.  · Severe allergic reactions (anaphylaxis).  Some medicines, such as blood pressure medicine or water pills (diuretics), may lower your blood pressure below normal. Sometimes taking too much medicine or taking medicine not as directed can cause hypotension.  TREATMENT   Hospitalization is sometimes required for hypotension if fluid or blood replacement is needed, if time is needed for medicines to wear off, or if further monitoring is needed. Treatment might include changing your diet, changing your medicines (including medicines aimed at raising your blood pressure), and use of support stockings.  HOME CARE INSTRUCTIONS   · Drink enough fluids to keep your urine clear or pale yellow.  · Take your medicines as directed by your health care Kaspar Albornoz.  · Get up slowly from reclining or sitting positions. This gives your blood pressure a chance to adjust.  · Wear support stockings as directed by your health care Shravya Wickwire.  · Maintain a healthy diet by including nutritious food, such as fruits, vegetables, nuts, whole grains, and lean meats.  SEEK MEDICAL CARE IF:  · You have vomiting or diarrhea.  · You have a fever for more than 2-3 days.  · You feel more thirsty than usual.  · You feel weak and  tired.  SEEK IMMEDIATE MEDICAL CARE IF:   · You have chest pain or a fast or irregular heartbeat.  · You have a loss of feeling in some part of your body, or you lose movement in your arms or legs.  · You have trouble speaking.  · You become sweaty or feel lightheaded.  · You faint.  MAKE SURE YOU:   · Understand these instructions.  · Will watch your condition.  · Will get help right away if you are not doing well or get worse.  Document Released: 05/04/2005 Document Revised: 02/22/2013 Document Reviewed: 11/04/2012  ExitCare® Patient Information ©2015 [employer]. This information is not intended to replace advice given to you by your health care Davaughn Hillyard. Make sure you discuss any questions you have with your health care Aela Bohan.

## 2015-02-01 NOTE — Progress Notes (Signed)
ANTIBIOTIC CONSULT NOTE - INITIAL  Pharmacy Consult for vancomycin and zosyn Indication: rule out sepsis  Allergies  Allergen Reactions  . Codeine     Unknown reaction  . Niacin And Related     Unknown reaction    Patient Measurements: Height:  (165.1 cm) Weight: 140 lb (63.504 kg) IBW/kg (Calculated) : 57   Vital Signs: Temp: 97.7 F (36.5 C) (09/16 1946) Temp Source: Rectal (09/16 1946) BP: 134/40 mmHg (09/16 2100) Pulse Rate: 63 (09/16 2119) Intake/Output from previous day:   Intake/Output from this shift:    Labs:  Recent Labs  02/01/15 1920  WBC 12.4*  HGB 15.0  PLT 304  CREATININE 1.37*   Estimated Creatinine Clearance: 22.6 mL/min (by C-G formula based on Cr of 1.37). No results for input(s): VANCOTROUGH, VANCOPEAK, VANCORANDOM, GENTTROUGH, GENTPEAK, GENTRANDOM, TOBRATROUGH, TOBRAPEAK, TOBRARND, AMIKACINPEAK, AMIKACINTROU, AMIKACIN in the last 72 hours.   Microbiology: Recent Results (from the past 720 hour(s))  Blood Culture (routine x 2)     Status: None (Preliminary result)   Collection Time: 02/01/15  7:30 PM  Result Value Ref Range Status   Specimen Description BLOOD LEFT HAND  Final   Special Requests BOTTLES DRAWN AEROBIC ONLY 6CC  Final   Culture PENDING  Incomplete   Report Status PENDING  Incomplete  Blood Culture (routine x 2)     Status: None (Preliminary result)   Collection Time: 02/01/15  7:35 PM  Result Value Ref Range Status   Specimen Description BLOOD LEFT HAND  Final   Special Requests BOTTLES DRAWN AEROBIC ONLY 5CC  Final   Culture PENDING  Incomplete   Report Status PENDING  Incomplete    Medical History: Past Medical History  Diagnosis Date  . Anxiety   . Hyperlipidemia   . Vertigo   . Carotid stenosis   . Vitamin D deficiency   . Hypertension   . Neuromuscular disorder     post herpetic neuralgia  . Gallstones   . Arthritis   . Carotid stenosis   . Vitamin D deficiency     Medications:  See medication  history Assessment: 81 yo lady to start broad spectrum antibiotics for r/o sepsis.  Initial doses of vancomycin and zosyn ordered in the ED  Goal of Therapy:  Vancomycin trough level 15-20 mcg/ml  Plan:  Zosyn 3.375 gm IV q8 hours Vancomycin 500 mg IV q24 hours F/u renal function, cultures and clinical progress  Talbert Cage Poteet 02/01/2015,9:28 PM

## 2015-02-01 NOTE — ED Notes (Signed)
CRITICAL VALUE ALERT  Critical value received:  K 2.7  Date of notification:  02/01/15  Time of notification:  2016  Critical value read back:Yes.    Nurse who received alert:  B. Garner Nash, RN  MD notified (1st page):  Denton Lank  Time of first page:  2016  MD notified (2nd page):  Time of second page:  Responding MD:  Denton Lank  Time MD responded:  2016

## 2015-02-01 NOTE — H&P (Addendum)
[facility] History and Physical  NIMCO BIVENS ZOX:[id number] DOB: Nov 04, 1918    PCP:   Junie Spencer, FNP   Chief Complaint: altered mental status and hypotension.  HPI: Catherine Steele is an 81 y.o. female with hx of anxiety, HTN, HLD, carotid stenosis, nursing home resident 2 weeks ago, brought to the ER as she has been hypotensive with SBP 70's and having confusion.  In the ER, she responded to IVF early on, and her resulting SBP was 131.  Further work up included a lactic acid of 2.5, Cr of 1.37, and K of 2.7.  Her CXR was clear, and UA was negative.  BC was done, and she was given IV Zenaida Niece and IV Zosyn, and hosptialist was asked to admit her for hypovolemia, but can't r/out sepsis.  When I saw her, she was alert, orient, and converse meaningfully.  She depends on her niece as her HCP, though she has a son.  She is a DNR and has a golden rod form.   Rewiew of Systems:  Constitutional: Negative for malaise, fever and chills. No significant weight loss or weight gain Eyes: Negative for eye pain, redness and discharge, diplopia, visual changes, or flashes of light. ENMT: Negative for ear pain, hoarseness, nasal congestion, sinus pressure and sore throat. No headaches; tinnitus, drooling, or problem swallowing. Cardiovascular: Negative for chest pain, palpitations, diaphoresis, dyspnea and peripheral edema. ; No orthopnea, PND Respiratory: Negative for cough, hemoptysis, wheezing and stridor. No pleuritic chestpain. Gastrointestinal: Negative for nausea, vomiting, diarrhea, constipation, abdominal pain, melena, blood in stool, hematemesis, jaundice and rectal bleeding.    Genitourinary: Negative for frequency, dysuria, incontinence,flank pain and hematuria; Musculoskeletal: Negative for back pain and neck pain. Negative for swelling and trauma.;  Skin: . Negative for pruritus, rash, abrasions, bruising and skin lesion.; ulcerations Neuro: Negative for headache, lightheadedness  and neck stiffness. Negative for weakness, altered level of consciousness , altered mental status, extremity weakness, burning feet, involuntary movement, seizure and syncope.  Psych: negative for anxiety, depression, insomnia, tearfulness, panic attacks, hallucinations, paranoia, suicidal or homicidal ideation    Past Medical History  Diagnosis Date  . Anxiety   . Hyperlipidemia   . Vertigo   . Carotid stenosis   . Vitamin D deficiency   . Hypertension   . Neuromuscular disorder     post herpetic neuralgia  . Gallstones   . Arthritis   . Carotid stenosis   . Vitamin D deficiency     History reviewed. No pertinent past surgical history.  Medications:  HOME MEDS: Prior to Admission medications   Medication Sig Start Date End Date Taking? Authorizing Provider  ALPRAZolam Prudy Feeler) 0.5 MG tablet Take 1 tablet (0.5 mg total) by mouth 2 (two) times daily as needed for anxiety (for restlessness and agitation). 12/24/14  Yes Junie Spencer, FNP  amLODipine (NORVASC) 5 MG tablet TAKE ONE TABLET BY MOUTH ONE  TIME DAILY 08/20/14  Yes Junie Spencer, FNP  aspirin 325 MG tablet Take 325 mg by mouth daily.   Yes Historical Provider, MD  atenolol (TENORMIN) 50 MG tablet TAKE 2 TABLETS BY MOUTH DAILY AS DIRECTED Patient taking differently: Take 50 mg by mouth 2 (two) times daily.  08/14/14  Yes Junie Spencer, FNP  Cholecalciferol (VITAMIN D3) 2000 UNITS TABS Take 1 tablet by mouth daily.   Yes Historical Provider, MD  cloNIDine (CATAPRES) 0.1 MG tablet TAKE ONE TABLET BY MOUTH THREE TIMES DAILY 08/27/14  Yes Junie Spencer, FNP  fluticasone (FLONASE) 50 MCG/ACT nasal spray Place 2 sprays into both nostrils daily. 02/23/14  Yes Junie Spencer, FNP  levothyroxine (SYNTHROID, LEVOTHROID) 75 MCG tablet TAKE 1 TABLET (75 MCG TOTAL)  BY MOUTH DAILY. 11/27/14  Yes Junie Spencer, FNP  losartan (COZAAR) 25 MG tablet TAKE ONE TABLET BY MOUTH ONE TIME DAILY 09/07/14  Yes Ernestina Penna, MD     Allergies:   Allergies  Allergen Reactions  . Codeine     Unknown reaction  . Niacin And Related     Unknown reaction    Social History:   reports that she has never smoked. She has never used smokeless tobacco. She reports that she does not drink alcohol or use illicit drugs.  Family History: History reviewed. No pertinent family history.   Physical Exam: Filed Vitals:   02/01/15 2115 02/01/15 2119 02/01/15 2130 02/01/15 2152  BP:   130/45   Pulse: 62 63 64 61  Temp:      TempSrc:      Resp: Height:      Weight:      SpO2: 94% 94% 90% 93%   Blood pressure 130/45, pulse 61, temperature 97.7 F (36.5 C), temperature source Rectal, resp. rate 25, height  (1.651 m), weight 63.504 kg (140 lb), SpO2 93 %.  GEN:  Pleasant  patient lying in the stretcher in no acute distress; cooperative with exam. PSYCH:  alert and oriented x4; does not appear anxious or depressed; affect is appropriate. HEENT: Mucous membranes pink and anicteric; PERRLA; EOM intact; no cervical lymphadenopathy nor thyromegaly or carotid bruit; no JVD; There were no stridor. Neck is very supple. Breasts:: Not examined CHEST WALL: No tenderness CHEST: Normal respiration, clear to auscultation bilaterally.  HEART: Regular rate and rhythm.  There are no murmur, rub, or gallops.   BACK: No kyphosis or scoliosis; no CVA tenderness ABDOMEN: soft and non-tender; no masses, no organomegaly, normal abdominal bowel sounds; no pannus; no intertriginous candida. There is no rebound and no distention. Rectal Exam: Not done EXTREMITIES: No bone or joint deformity; age-appropriate arthropathy of the hands and knees; no edema; no ulcerations.  There is no calf tenderness. Genitalia: not examined PULSES: 2+ and symmetric SKIN: Normal hydration no rash or ulceration CNS: Cranial nerves 2-12 grossly intact no focal lateralizing neurologic deficit.  Speech is fluent; uvula elevated with phonation, facial symmetry and tongue  midline. DTR are normal bilaterally, cerebella exam is intact, barbinski is negative and strengths are equaled bilaterally.  No sensory loss.   Labs on Admission:  Basic Metabolic Panel:  Recent Labs Lab 02/01/15 1920 02/01/15 1935  NA 139  --   K 2.7*  --   CL 95*  --   CO2 33*  --   GLUCOSE 136*  --   BUN 22*  --   CREATININE 1.37*  --   CALCIUM 9.1  --   MG  --  1.7   Liver Function Tests:  Recent Labs Lab 02/01/15 1920  AST 20  ALT 13*  ALKPHOS 95  BILITOT 0.9  PROT 6.3*  ALBUMIN 3.4*   CBC:  Recent Labs Lab 02/01/15 1920  WBC 12.4*  NEUTROABS 8.8*  HGB 15.0  HCT 44.9  MCV 92.0  PLT 304   Cardiac Enzymes:  Recent Labs Lab 02/01/15 1920  TROPONINI 0.04*    Radiological Exams on Admission: Dg Chest Port 1 View  02/01/2015   CLINICAL DATA:  Weakness for several  days. Hypotension. Rule out pneumonia.  EXAM: PORTABLE CHEST - 1 VIEW  COMPARISON:  12/16/2010  FINDINGS: The cardiomediastinal contours are unchanged with stable cardiomegaly. Pulmonary vasculature is normal. No consolidation, pleural effusion, or pneumothorax. No acute osseous abnormalities are seen. There is an old right rib fracture per  IMPRESSION: Unchanged cardiomegaly.  No consolidation to suggest pneumonia.   Electronically Signed   By: Rubye Oaks M.D.   On: 02/01/2015 21:09   Assessment/Plan Present on Admission:  . Volume depletion . HTN (hypertension) . Hypothyroidism . DNR (do not resuscitate) . Hypotension  PLAN:  It is difficult to exclude sepsis, so I have used the sepsis order set, but I think it is more likely that she has hypotension due to medication and volume depletion.  Will give some IVF, but she may not be able to take the "30cc per Kg" total.  In addition, IVF thru antibiotics, and 4 K riders should be adequate.  Will start her on IV Van and Zosyn pending blood cultures.  I have held her HTN meds.  She also has elevated Cr, and will follow thru with IVF and holding  her ACE I.  She is stable, and is a DNR.  She will be admtited to telemetry under [facility] servcice.  Thank you so much.   Other plans as per orders.  Code Status  Eartha Inch, MD. [facility] Pager [phone] 7pm to 7am.  02/01/2015, 9:56 PM

## 2015-02-01 NOTE — ED Notes (Signed)
CRITICAL VALUE ALERT  Critical value received:  Lactic acid 2.57  Date of notification:  02/01/15  Time of notification:  1922  Critical value read back:No.  Nurse who received alert:  B.[email or web address], rn  MD notified (1st page):  steinl  Time of first page:  1922  MD notified (2nd page):  Time of second page:  Responding MD:  steinl  Time MD responded:  [phone]

## 2015-02-01 NOTE — ED Notes (Signed)
Late entry- per report by [facility], call came out for gen. Weakness, pt found to be hypotensive as well at doctor's office, reported that pt was having hallucinations last night and possible UTI, pt unable to state where her pain was located but stated that she did not feel well.

## 2015-02-01 NOTE — ED Provider Notes (Addendum)
CSN: [id number]     Arrival date & time 02/01/15  1856 History   First MD Initiated Contact with Patient 02/01/15 1859     Chief Complaint  Patient presents with  . Hypotension     (Consider location/radiation/quality/duration/timing/severity/associated sxs/prior Treatment) The history is provided by the patient and the EMS personnel. The history is limited by the condition of the patient.  Patient w hx htn, c/o generally feel 'bad', generally weak, for the past several days. Pt very limited historian, just states feels bad all over. No focal pain. Denies headache. No chest pain. No sob. No abd pain. Denies cough or uri c/o. No vomiting or diarrhea. Denies dysuria or odor to urine. Denies any recent change in meds. No fever or chills. Went to pcp office, and was noted to have very low bp - on discussions with pt/fam, pts dnr status was confirmed, dnr w pt.        Past Medical History  Diagnosis Date  . Anxiety   . Hyperlipidemia   . Vertigo   . Carotid stenosis   . Vitamin D deficiency   . Hypertension   . Neuromuscular disorder     post herpetic neuralgia  . Gallstones   . Arthritis   . Carotid stenosis   . Vitamin D deficiency    History reviewed. No pertinent past surgical history. History reviewed. No pertinent family history. Social History  Substance Use Topics  . Smoking status: Never Smoker   . Smokeless tobacco: Never Used  . Alcohol Use: No   OB History    No data available     Review of Systems  Constitutional: Negative for fever and chills.  HENT: Negative for sore throat.   Eyes: Negative for visual disturbance.  Respiratory: Negative for cough and shortness of breath.   Cardiovascular: Negative for chest pain.  Gastrointestinal: Negative for vomiting, abdominal pain and blood in stool.  Endocrine: Negative for polyuria.  Genitourinary: Negative for dysuria.  Musculoskeletal: Negative for back pain and neck pain.  Skin: Negative for rash.   Neurological: Negative for headaches.  Hematological: Does not bruise/bleed easily.  Psychiatric/Behavioral: Negative for agitation.      Allergies  Codeine and Niacin and related  Home Medications   Prior to Admission medications   Medication Sig Start Date End Date Taking? Authorizing Provider  ALPRAZolam Prudy Feeler) 0.5 MG tablet Take 1 tablet (0.5 mg total) by mouth 2 (two) times daily as needed for anxiety (for restlessness and agitation). 12/24/14   Junie Spencer, FNP  amLODipine (NORVASC) 5 MG tablet TAKE ONE TABLET BY MOUTH ONE  TIME DAILY 08/20/14   Junie Spencer, FNP  aspirin 325 MG tablet Take 325 mg by mouth daily.    Historical Provider, MD  atenolol (TENORMIN) 50 MG tablet TAKE 2 TABLETS BY MOUTH DAILY AS DIRECTED 08/14/14   Junie Spencer, FNP  Cholecalciferol (VITAMIN D3) 2000 UNITS TABS Take 1 tablet by mouth daily.    Historical Provider, MD  cloNIDine (CATAPRES) 0.1 MG tablet TAKE ONE TABLET BY MOUTH THREE TIMES DAILY 08/27/14   Junie Spencer, FNP  fluticasone (FLONASE) 50 MCG/ACT nasal spray Place 2 sprays into both nostrils daily. 02/23/14   Junie Spencer, FNP  levothyroxine (SYNTHROID, LEVOTHROID) 75 MCG tablet TAKE 1 TABLET (75 MCG TOTAL)  BY MOUTH DAILY. 11/27/14   Junie Spencer, FNP  losartan (COZAAR) 25 MG tablet TAKE ONE TABLET BY MOUTH ONE TIME DAILY 09/07/14   Ernestina Penna, MD  BP 105/46 mmHg  Pulse 59  Resp 18  Ht 5\' 5"  (1.651 m)  Wt 140 lb (63.504 kg)  BMI 23.30 kg/m2  SpO2 96% Physical Exam  Constitutional: She appears well-developed.  HENT:  Head: Atraumatic.  Mouth/Throat: Oropharynx is clear and moist.  Eyes: Conjunctivae are normal. Pupils are equal, round, and reactive to light. No scleral icterus.  Neck: Neck supple. No tracheal deviation present.  No stiffness or rigidity  Cardiovascular: Normal rate, regular rhythm, normal heart sounds and intact distal pulses.  Exam reveals no gallop and no friction rub.   No murmur  heard. Pulmonary/Chest: Effort normal and breath sounds normal. No respiratory distress.  Abdominal: Soft. Normal appearance and bowel sounds are normal. She exhibits no distension and no mass. There is no tenderness. There is no rebound and no guarding.  Genitourinary:  No cva tenderness  Musculoskeletal: She exhibits no edema or tenderness.  Neurological: She is alert.  Alert, speech quiet, responses limited, but not grossly dysarthric. Moves bil extremities purposefully.   Skin: Skin is warm and dry. No rash noted. She is not diaphoretic.  Psychiatric: She has a normal mood and affect.  Nursing note and vitals reviewed.   ED Course  Procedures (including critical care time) Labs Review  Results for orders placed or performed during the hospital encounter of 02/01/15  Blood Culture (routine x 2)  Result Value Ref Range   Specimen Description BLOOD LEFT HAND    Special Requests BOTTLES DRAWN AEROBIC ONLY 6CC    Culture PENDING    Report Status PENDING   Blood Culture (routine x 2)  Result Value Ref Range   Specimen Description BLOOD LEFT HAND    Special Requests BOTTLES DRAWN AEROBIC ONLY 5CC    Culture PENDING    Report Status PENDING   Comprehensive metabolic panel  Result Value Ref Range   Sodium 139 135 - 145 mmol/L   Potassium 2.7 (LL) 3.5 - 5.1 mmol/L   Chloride 95 (L) 101 - 111 mmol/L   CO2 33 (H) 22 - 32 mmol/L   Glucose, Bld 136 (H) 65 - 99 mg/dL   BUN 22 (H) 6 - 20 mg/dL   Creatinine, Ser 1.61 (H) 0.44 - 1.00 mg/dL   Calcium 9.1 8.9 - 09.6 mg/dL   Total Protein 6.3 (L) 6.5 - 8.1 g/dL   Albumin 3.4 (L) 3.5 - 5.0 g/dL   AST 20 15 - 41 U/L   ALT 13 (L) 14 - 54 U/L   Alkaline Phosphatase 95 38 - 126 U/L   Total Bilirubin 0.9 0.3 - 1.2 mg/dL   GFR calc non Af Amer 32 (L) >60 mL/min   GFR calc Af Amer 37 (L) >60 mL/min   Anion gap 11 5 - 15  CBC WITH DIFFERENTIAL  Result Value Ref Range   WBC 12.4 (H) 4.0 - 10.5 K/uL   RBC 4.88 3.87 - 5.11 MIL/uL   Hemoglobin  15.0 12.0 - 15.0 g/dL   HCT 04.5 40.9 - 81.1 %   MCV 92.0 78.0 - 100.0 fL   MCH 30.7 26.0 - 34.0 pg   MCHC 33.4 30.0 - 36.0 g/dL   RDW 91.4 78.2 - 95.6 %   Platelets 304 150 - 400 K/uL   Neutrophils Relative % 71 %   Neutro Abs 8.8 (H) 1.7 - 7.7 K/uL   Lymphocytes Relative 19 %   Lymphs Abs 2.3 0.7 - 4.0 K/uL   Monocytes Relative 9 %   Monocytes Absolute 1.1 (  H) 0.1 - 1.0 K/uL   Eosinophils Relative 1 %   Eosinophils Absolute 0.1 0.0 - 0.7 K/uL   Basophils Relative 0 %   Basophils Absolute 0.0 0.0 - 0.1 K/uL  Troponin I  Result Value Ref Range   Troponin I 0.04 (H) <0.031 ng/mL  I-Stat CG4 Lactic Acid, ED  (not at  [facility])  Result Value Ref Range   Lactic Acid, Venous 2.57 (HH) 0.5 - 2.0 mmol/L   Comment NOTIFIED PHYSICIAN        I have personally reviewed and evaluated these images and lab results as part of my medical decision-making.   EKG Interpretation   Date/Time:  Friday February 01 2015 19:18:46 EDT Ventricular Rate:  58 PR Interval:  171 QRS Duration: 167 QT Interval:  520 QTC Calculation: 511 R Axis:   -23 Text Interpretation:  Sinus rhythm Left bundle branch block No significant  change since last tracing Confirmed by Denton Lank  MD, Caryn Bee ([id number]) on  02/01/2015 7:24:28 PM      MDM   Iv ns bolus. Labs.  Reviewed nursing notes and prior charts for additional history.   Lactate elevated, will give 30 cc/kg ns bolus. ?volume depletion vs sepsis.   Cultures pending.   Will give empiric abx, vanc and zosyn.  Cxr, ua, cultures pending.  rechck pt, bp improved from prior - at pcp office bps in trange 77/43, then 85/53.  Repeat lactate pending.  k low, 2.7. Magnesium added to labs. kcl iv and po.   Hospitalists called to admit.  CRITICAL CARE  RE:  Hypotension, elevated lactate r/o sepsis vs volume depletion, hypokalemia, dehydration, weakness Performed by: Suzi Roots Total critical care time: 35 Critical care time was exclusive of separately  billable procedures and treating other patients. Critical care was necessary to treat or prevent imminent or life-threatening deterioration. Critical care was time spent personally by me on the following activities: development of treatment plan with patient and/or surrogate as well as nursing, discussions with consultants, evaluation of patient's response to treatment, examination of patient, obtaining history from patient or surrogate, ordering and performing treatments and interventions, ordering and review of laboratory studies, ordering and review of radiographic studies, pulse oximetry and re-evaluation of patient's condition.         Cathren Laine, MD 02/01/15 2103

## 2015-02-02 DIAGNOSIS — N179 Acute kidney failure, unspecified: Secondary | ICD-10-CM

## 2015-02-02 DIAGNOSIS — G9340 Encephalopathy, unspecified: Secondary | ICD-10-CM

## 2015-02-02 DIAGNOSIS — N390 Urinary tract infection, site not specified: Principal | ICD-10-CM

## 2015-02-02 DIAGNOSIS — I959 Hypotension, unspecified: Secondary | ICD-10-CM

## 2015-02-02 LAB — BASIC METABOLIC PANEL
Anion gap: 7 (ref 5–15)
BUN: 16 mg/dL (ref 6–20)
CHLORIDE: 102 mmol/L (ref 101–111)
CO2: 31 mmol/L (ref 22–32)
CREATININE: 1.12 mg/dL — AB (ref 0.44–1.00)
Calcium: 8.1 mg/dL — ABNORMAL LOW (ref 8.9–10.3)
GFR calc Af Amer: 47 mL/min — ABNORMAL LOW (ref >60)
GFR calc non Af Amer: 41 mL/min — ABNORMAL LOW (ref >60)
GLUCOSE: 93 mg/dL (ref 65–99)
Potassium: 3.3 mmol/L — ABNORMAL LOW (ref 3.5–5.1)
SODIUM: 140 mmol/L (ref 135–145)

## 2015-02-02 LAB — CMP14+EGFR
A/G RATIO: 1.5 (ref 1.1–2.5)
ALT: 12 IU/L (ref 0–32)
AST: 18 IU/L (ref 0–40)
Albumin: 3.5 g/dL (ref 3.2–4.6)
Alkaline Phosphatase: 101 IU/L (ref 39–117)
BUN/Creatinine Ratio: 17 (ref 11–26)
BUN: 19 mg/dL (ref 10–36)
Bilirubin Total: 0.6 mg/dL (ref 0.0–1.2)
CALCIUM: 9.8 mg/dL (ref 8.7–10.3)
CO2: 24 mmol/L (ref 18–29)
Chloride: 94 mmol/L — ABNORMAL LOW (ref 97–108)
Creatinine, Ser: 1.09 mg/dL — ABNORMAL HIGH (ref 0.57–1.00)
GFR calc non Af Amer: 44 mL/min/{1.73_m2} — ABNORMAL LOW (ref >59)
GFR, EST AFRICAN AMERICAN: 50 mL/min/{1.73_m2} — AB (ref >59)
GLOBULIN, TOTAL: 2.3 g/dL (ref 1.5–4.5)
Glucose: 127 mg/dL — ABNORMAL HIGH (ref 65–99)
POTASSIUM: 3.4 mmol/L — AB (ref 3.5–5.2)
SODIUM: 142 mmol/L (ref 134–144)
TOTAL PROTEIN: 5.8 g/dL — AB (ref 6.0–8.5)

## 2015-02-02 LAB — CBC WITH DIFFERENTIAL/PLATELET
BASOS: 0 %
Basophils Absolute: 0 10*3/uL (ref 0.0–0.2)
EOS (ABSOLUTE): 0.1 10*3/uL (ref 0.0–0.4)
EOS: 1 %
HEMATOCRIT: 46.2 % (ref 34.0–46.6)
Hemoglobin: 15.2 g/dL (ref 11.1–15.9)
IMMATURE GRANS (ABS): 0 10*3/uL (ref 0.0–0.1)
IMMATURE GRANULOCYTES: 0 %
LYMPHS: 26 %
Lymphocytes Absolute: 2.6 10*3/uL (ref 0.7–3.1)
MCH: 29.6 pg (ref 26.6–33.0)
MCHC: 32.9 g/dL (ref 31.5–35.7)
MCV: 90 fL (ref 79–97)
Monocytes Absolute: 1 10*3/uL — ABNORMAL HIGH (ref 0.1–0.9)
Monocytes: 10 %
NEUTROS ABS: 6.4 10*3/uL (ref 1.4–7.0)
NEUTROS PCT: 63 %
Platelets: 328 10*3/uL (ref 150–379)
RBC: 5.13 x10E6/uL (ref 3.77–5.28)
RDW: 14.3 % (ref 12.3–15.4)
WBC: 10.2 10*3/uL (ref 3.4–10.8)

## 2015-02-02 LAB — CBC
HCT: 40.4 % (ref 36.0–46.0)
HEMOGLOBIN: 13.6 g/dL (ref 12.0–15.0)
MCH: 30.7 pg (ref 26.0–34.0)
MCHC: 33.7 g/dL (ref 30.0–36.0)
MCV: 91.2 fL (ref 78.0–100.0)
Platelets: 274 10*3/uL (ref 150–400)
RBC: 4.43 MIL/uL (ref 3.87–5.11)
RDW: 13.8 % (ref 11.5–15.5)
WBC: 14.3 10*3/uL — ABNORMAL HIGH (ref 4.0–10.5)

## 2015-02-02 LAB — TROPONIN I: Troponin I: 0.03 ng/mL (ref <0.031)

## 2015-02-02 LAB — PROCALCITONIN: Procalcitonin: <0.1 ng/mL

## 2015-02-02 MED ORDER — DEXTROSE 5 % IV SOLN
1.0000 g | INTRAVENOUS | Status: DC
  Administered 2015-02-02 – 2015-02-03 (×2): 1 g via INTRAVENOUS
  Filled 2015-02-02 (×3): qty 10

## 2015-02-02 MED ORDER — POTASSIUM CHLORIDE CRYS ER 20 MEQ PO TBCR
40.0000 meq | EXTENDED_RELEASE_TABLET | ORAL | Status: AC
  Administered 2015-02-02 (×2): 40 meq via ORAL
  Filled 2015-02-02 (×2): qty 2

## 2015-02-02 MED ORDER — PIPERACILLIN-TAZOBACTAM 3.375 G IVPB
INTRAVENOUS | Status: AC
  Filled 2015-02-02: qty 50

## 2015-02-02 NOTE — Progress Notes (Signed)
Utilization review Completed Kimberly Welborn RN BSN   

## 2015-02-02 NOTE — Progress Notes (Signed)
PROGRESS NOTE  Catherine Steele [facility] ZOX:[id number] DOB: 11-16-1918 DOA: 02/01/2015 PCP: Junie Spencer, FNP  Summary: 38 yof with a pmhx of anxiety, HTN, HLD, carotid stenosis, resident of [facility], presented to PCP for generalized weakness, hallucinations; found to be hypotensive and confused. In ED hypotensive, responded to fluids, mildly elevated lactate. Pt was admitted for hypovolemia and possible sepsis.    Assessment/Plan: 1. Hypotension, likely secondary to hypovolemia and Cozaar rather than sepsis (considered on admission but doubtful). Now hypertensive. Troponin equivocal, procalcitonin <0.10, repeat LA WNL. U/A with 3-6 WBC, negative Nitrite and LE. Modest leukocytosis, no fever or localizing signs or symptoms. 2. Acute encephalopathy with hallucinations. Appears somewhat improved, suspect underlying dementia. Possible UTI 3. Possible UTI 4. Dehydration. Resolved 5. AKI. Resolved. Hold Cozaar. 6. Hypokalemia. Resolved  7. HTN. Stable.    Overall appears improved, now hypertensive, afebrile   Continue abx for possible UTI, will narrow to monotherapy with Rocephin   Replete potassium. Repeat CBC and BMP in morning  Possible discharge in 1-2 days   Code Status: DNR DVT prophylaxis: Lovenox Family Communication: Edwinna Areola, [place] and health care [phone] Disposition Plan: Discharged once improved   Brendia Sacks, MD  [facility]  Pager [phone] If 7PM-7AM, please contact night-coverage at [email or web address], password [facility] 02/02/2015, 6:18 AM  LOS: 1 day   Consultants:    Procedures:    Antibiotics:  Zosyn 9/17 >>9/17  Vancomycin 9/17 >>9/17  Rocephin 9/17 >>  HPI/Subjective: Doesn't feel good but doesn't know what is bothering her. Confused. Intermittent pain, no n/v. Ate okay but doesn't want food.   History appears unreliable.  Objective: Filed Vitals:   02/01/15 2200 02/01/15 2215 02/01/15 2303 02/02/15 0159  BP: 112/37  154/40  147/72  Pulse: 56 63 62 64  Temp:    98 F (36.7 C)  TempSrc:    Oral  Resp: Height:      Weight:   65 kg (143 lb 4.8 oz)   SpO2: 92% 95% 98% 95%   No intake or output data in the 24 hours ending 02/02/15 0618   Filed Weights   02/01/15 1858 02/01/15 2303  Weight: 63.504 kg (140 lb) 65 kg (143 lb 4.8 oz)    Exam:    Afebrile, no hypoxia  General:  Appears calm and comfortable, lying in bed.  Eyes: Wears glasses. Left pupil eccentric but reactive. Some corneal scarring  ENT: grossly normal hearing, lips & tongue Cardiovascular: RRR, no m/r, 2/6 systolic murmur RUSB. No LE edema. Respiratory: CTA bilaterally, no w/r/r. Normal respiratory effort. Abdomen: soft, ntnd Skin: no rash or induration noted Musculoskeletal: grossly normal tone BUE/BLE. "Care tracker" on right ankle Psychiatric: appears confused, some speech inappropriate Neurologic: grossly non-focal. Oriented to self, place, president    New data reviewed:  BUN has normalized, creatinine improved, 1.12   K+ improved 3.3   Repeat lactic acid, 1.49. procalcitonin normal   WBC slightly elevated  Remainder CBC unremarkable  Pertinent data since admission:  CXR: IMPRESSION: Unchanged cardiomegaly. No consolidation to suggest pneumonia.  EKG SR, LBBB, NSCSLT 09/03/2012  Initial troponin .04  Pending data:  BC  UC   Scheduled Meds: . sodium chloride   Intravenous STAT  . aspirin  325 mg Oral Daily  . enoxaparin (LOVENOX) injection  30 mg Subcutaneous Q24H  . fluticasone  2 spray Each Nare Daily  . levothyroxine  75 mcg Oral QAC breakfast  . piperacillin-tazobactam (ZOSYN)  IV  3.375  g Intravenous Q8H  . sodium chloride  3 mL Intravenous Q12H  . vancomycin  500 mg Intravenous Q24H   Continuous Infusions:   Principal Problem:   Hypotension Active Problems:   HTN (hypertension)   Hypothyroidism   Volume depletion   DNR (do not resuscitate)   UTI (urinary tract infection)   Acute  encephalopathy   AKI (acute kidney injury)   Time spent 25 minutes  By signing my name below, I, Arielle Khosrowpour, attest that this documentation has been prepared under the direction and in the presence of Daniel P. Irene Limbo, MD. Electronically signed: Dawayne Cirri. 02/02/2015  I personally performed the services described in this documentation. All medical record entries made by the scribe were at my direction. I have reviewed the chart and agree that the record reflects my personal performance and is accurate and complete. Brendia Sacks, MD

## 2015-02-03 DIAGNOSIS — R627 Adult failure to thrive: Secondary | ICD-10-CM

## 2015-02-03 DIAGNOSIS — I950 Idiopathic hypotension: Secondary | ICD-10-CM

## 2015-02-03 LAB — CBC
HEMATOCRIT: 45 % (ref 36.0–46.0)
HEMOGLOBIN: 15 g/dL (ref 12.0–15.0)
MCH: 30.8 pg (ref 26.0–34.0)
MCHC: 33.3 g/dL (ref 30.0–36.0)
MCV: 92.4 fL (ref 78.0–100.0)
Platelets: 264 10*3/uL (ref 150–400)
RBC: 4.87 MIL/uL (ref 3.87–5.11)
RDW: 14.2 % (ref 11.5–15.5)
WBC: 13.2 10*3/uL — AB (ref 4.0–10.5)

## 2015-02-03 LAB — BASIC METABOLIC PANEL
ANION GAP: 10 (ref 5–15)
BUN: 12 mg/dL (ref 6–20)
CALCIUM: 9 mg/dL (ref 8.9–10.3)
CO2: 28 mmol/L (ref 22–32)
Chloride: 105 mmol/L (ref 101–111)
Creatinine, Ser: 0.92 mg/dL (ref 0.44–1.00)
GFR, EST AFRICAN AMERICAN: 60 mL/min — AB (ref >60)
GFR, EST NON AFRICAN AMERICAN: 52 mL/min — AB (ref >60)
GLUCOSE: 95 mg/dL (ref 65–99)
POTASSIUM: 4 mmol/L (ref 3.5–5.1)
SODIUM: 143 mmol/L (ref 135–145)

## 2015-02-03 MED ORDER — ALPRAZOLAM 0.5 MG PO TABS: 0.5000 mg | ORAL_TABLET | Freq: Two times a day (BID) | ORAL | Status: DC | PRN

## 2015-02-03 MED ORDER — CEFUROXIME AXETIL 250 MG PO TABS
500.0000 mg | ORAL_TABLET | Freq: Two times a day (BID) | ORAL | Status: DC
  Administered 2015-02-04: 500 mg via ORAL
  Filled 2015-02-03: qty 2

## 2015-02-03 NOTE — Progress Notes (Addendum)
PROGRESS NOTE  Catherine Steele [facility] ZOX:[id number] DOB: [birth year] DOA: 02/01/2015 PCP: Junie Spencer, FNP  Summary: 40 yof with a history of anxiety and carotid stenosis presented with hypotension and confusion.  In ED hypotension responded to fluids. She was noted to have a mildly elevated lactate and leukocytosis. Pt was admitted for further management of hypovolemia and possible sepsis.    Assessment/Plan: 1. Acute encephalopathy with hallucinations, appears resolved. Alert and oriented. Discussed with POA Catherine Steele, reports patient near baseline at this point. Has moments of confusion but is generally oriented. Moved to ALF a few months ago when she started calling police repeatedly for fictitious intruders.  2. Possible UTI, treating empirically with broad spectrum abx. Continue Rocephin. Culture pending. 3. Hypotension, likely secondary to hypovolemia, FTT and Cozaar rather than sepsis (considered on admission but doubtful). Remains hypertensive. Troponin negative, procalcitonin <0.10, elevated LA resolved.  UA revealed 3-6 WBC but was otherwise unremarkable. UC pending. Leukocytosis trending down, no fever or localizing signs of infection.  BC reveals no growth after 2 days.  4. Dehydration. Resolved 5. AKI. Resolved. Continue to hold Cozaar. 6. Hypokalemia. Resolved  7. HTN. Stable.    Overall improved, likely at baseline. Change to oral abx, plan return to ALF in AM.  Reviewed hospitalization with POA Catherine Steele by telephone.   Code Status: DNR DVT prophylaxis: Lovenox Family Communication: Catherine Steele, [place] and health care proxy- [phone]. No family at bedside. Discussed with patient who understands and has no concerns at this time. Disposition Plan: Anticipate discharge within 24 hours pending conversation with POA.   Brendia Sacks, MD  [facility]  Pager [phone] If 7PM-7AM, please contact night-coverage at [email or web address], password [facility] 02/03/2015, 8:08  AM  LOS: 2 days   Consultants:    Procedures:    Antibiotics:  Zosyn 9/17 >>9/17  Vancomycin 9/17 >>9/17  Rocephin 9/17 >>  HPI/Subjective: Able to eat breakfast and drink. Denies pain, nausea, or vomiting.   Objective: Filed Vitals:   02/02/15 1039 02/02/15 1348 02/02/15 2107 02/03/15 0602  BP: 131/68 151/54 135/78 170/61  Pulse: 59 65 93 77  Temp: 98.1 F (36.7 C) 97.6 F (36.4 C) 98.1 F (36.7 C) 97.7 F (36.5 C)  TempSrc: Oral Oral Oral Oral  Resp: 18 48 20 20  Height:      Weight:      SpO2: 95% 100% 96% 94%    Intake/Output Summary (Last 24 hours) at 02/03/15 0808 Last data filed at 02/03/15 0005  Gross per 24 hour  Intake    240 ml  Output      5 ml  Net    235 ml     Filed Weights   02/01/15 1858 02/01/15 2303  Weight: 63.504 kg (140 lb) 65 kg (143 lb 4.8 oz)    Exam:    VSS, not hypoxic, afebrile General:  Appears comfortable, calm. ENT: grossly normal hearing Cardiovascular: Regular rate and rhythm, no rub or gallop. No lower extremity edema. 2/6 systolic murmur LUSB Respiratory: Clear to auscultation bilaterally, no wheezes, rales or rhonchi. Normal respiratory effort. Abdomen: soft, ntnd Musculoskeletal: grossly normal tone bilateral upper and lower extremities Psychiatric: grossly normal mood and affect, speech fluent and appropriate, oriented to person, time (month and year), and president. Believes she is in the doctor's office Neurologic: grossly non-focal.  New data reviewed:  BMP unremarkable, potassium and creatinine normal  Troponin negative  WBC trending down, 13.2 otherwise CBC unremarkable  Pertinent data since admission:  CXR: IMPRESSION:  Unchanged cardiomegaly. No consolidation to suggest pneumonia.  Pending data:  BC  UC   Scheduled Meds: . aspirin  325 mg Oral Daily  . cefTRIAXone (ROCEPHIN)  IV  1 g Intravenous Q24H  . enoxaparin (LOVENOX) injection  30 mg Subcutaneous Q24H  . fluticasone  2 spray Each  Nare Daily  . levothyroxine  75 mcg Oral QAC breakfast  . sodium chloride  3 mL Intravenous Q12H   Continuous Infusions:   Principal Problem:   UTI (urinary tract infection) Active Problems:   HTN (hypertension)   Hypothyroidism   Volume depletion   DNR (do not resuscitate)   Hypotension   Acute encephalopathy   AKI (acute kidney injury)   FTT (failure to thrive) in adult    By signing my name below, I, Burnett Harry attest that this documentation has been prepared under the direction and in the presence of Brendia Sacks, MD Electronically signed: Burnett Harry, Scribe.  02/03/2015  I personally performed the services described in this documentation. All medical record entries made by the scribe were at my direction. I have reviewed the chart and agree that the record reflects my personal performance and is accurate and complete. Brendia Sacks, MD

## 2015-02-04 DIAGNOSIS — I952 Hypotension due to drugs: Secondary | ICD-10-CM

## 2015-02-04 DIAGNOSIS — R627 Adult failure to thrive: Secondary | ICD-10-CM

## 2015-02-04 LAB — URINE CULTURE: Culture: NO GROWTH

## 2015-02-04 MED ORDER — CEFUROXIME AXETIL 500 MG PO TABS: 500.0000 mg | ORAL_TABLET | Freq: Two times a day (BID) | ORAL | Status: DC

## 2015-02-04 NOTE — Care Management Note (Signed)
Case Management Note  Patient Details  Name: Catherine Steele MRN: [id number] Date of Birth: 1919-01-22  Expected Discharge Date:    02/04/2015              Expected Discharge Plan:  Assisted Living / Rest Home  In-House Referral:  Clinical Social Work  Discharge planning Services  CM Consult  Post Acute Care Choice:  Resumption of Svcs/PTA Provider Choice offered to:     DME Arranged:    DME Agency:     HH Arranged:  RN, PT HH Agency:  [facility]  Status of Service:  Completed, signed off  Medicare Important Message Given:  Yes-second notification given Date Medicare IM Given:    Medicare IM give by:    Date Additional Medicare IM Given:    Additional Medicare Important Message give by:     If discussed at Long Length of Stay Meetings, dates discussed:    Additional Comments: Admitted with UTI. Pt is from [facility], where pt receives [facility] RN/PT services though [facility]. Otelia Santee, of [facility], made aware of admission and discharge back to ALF today. Orders to resume HH at [facility] have been placed. Otelia Santee, from [facility] will obtain pt info from chart. CM discussed with pt plan for return to facility today with resumption of HH services and pt aware that [facility] has 48 hours to resume services. CSW is arranging for return to facility and communicating with pt family. No further CM needs.  Malcolm Metro, RN 02/04/2015, 11:35 AM

## 2015-02-04 NOTE — Progress Notes (Signed)
Patient discharged back to [facility].  IV removed -WNL.  Packet prepared per SW and facility aware.  Patient stable at this time.  Assisted off unit via WC by NT.

## 2015-02-04 NOTE — Progress Notes (Signed)
PROGRESS NOTE  Catherine Steele [facility] XBJ:[id number] DOB: 06-13-1918 DOA: 02/01/2015 PCP: Junie Spencer, FNP  Summary: 45 yof with a history of anxiety and carotid stenosis presented with hypotension and confusion.  In ED hypotension responded to fluids. She was noted to have a mildly elevated lactate and leukocytosis. Pt was admitted for further management of hypovolemia and possible sepsis.    Assessment/Plan: 1. Acute encephalopathy with hallucinations, resolved. Secondary to UTI. 2. UTI, clinically resolved, afebrile. On oral abx.  3. Hypotension, secondary to hypovolemia, FTT and Cozaar rather than sepsis (considered on admission but no evidence) is resolved. Hypertensive.  UC pending. WBC unremarkable, BC reveals no growth after 3 days.  4. Dehydration. Resolved 5. AKI. Resolved. Continue to hold Cozaar. 6. Hypokalemia. Resolved  7. HTN. Stable.  8. Suspected early dementia. Discussed with POA Edwinna Areola, reports patient near baseline. Has moments of confusion but is generally oriented. Moved to ALF a few months ago when she started calling police repeatedly for fictitious intruders.    At baseline, doing well. Finish oral abx and return to ALF.    Code Status: DNR DVT prophylaxis: Lovenox Family Communication: Edwinna Areola, [place] and health care proxy- [phone].  Disposition Plan: Anticipate discharge to ALF   Brendia Sacks, MD  [facility]  Pager [phone] If 7PM-7AM, please contact night-coverage at [email or web address], password [facility] 02/04/2015, 8:13 AM  LOS: 3 days   Consultants:    Procedures:    Antibiotics:  Zosyn 9/17 >>9/17  Vancomycin 9/17 >>9/17  Rocephin 9/17 >>9/18  Ceftin 9/19>>9/22  HPI/Subjective: Able to sleep last night. Denies any nausea, vomiting, or difficulty breathing.   Objective: Filed Vitals:   02/03/15 0602 02/03/15 1451 02/03/15 2100 02/04/15 0558  BP: 170/61 128/60 145/97 175/63  Pulse: 77 76 94 88  Temp: 97.7 F (36.5  C) 98.2 F (36.8 C) 98.6 F (37 C) 99.1 F (37.3 C)  TempSrc: Oral Tympanic Oral Axillary  Resp: Height:      Weight:      SpO2: 94% 96% 95% 95%    Intake/Output Summary (Last 24 hours) at 02/04/15 0813 Last data filed at 02/03/15 2339  Gross per 24 hour  Intake    488 ml  Output      5 ml  Net    483 ml     Filed Weights   02/01/15 1858 02/01/15 2303  Weight: 63.504 kg (140 lb) 65 kg (143 lb 4.8 oz)    Exam: VSS, Afebrile, not hypoxic General:  Appears calm and comfortable. Sitting in chair. Cardiovascular: RRR, no r/g. No LE edema. 2/6 systolic murmur RUSB Respiratory: CTA bilaterally, no w/r/r. Normal respiratory effort. Abdomen: soft, ntnd Musculoskeletal: grossly normal tone BUE/BLE Psychiatric: grossly normal mood and affect, speech fluent and appropriate  New data reviewed:  none  Pertinent data since admission:  CXR: IMPRESSION: Unchanged cardiomegaly. No consolidation to suggest pneumonia.  Pending data:  BC  UC   Scheduled Meds: . aspirin  325 mg Oral Daily  . cefUROXime  500 mg Oral BID WC  . enoxaparin (LOVENOX) injection  30 mg Subcutaneous Q24H  . fluticasone  2 spray Each Nare Daily  . levothyroxine  75 mcg Oral QAC breakfast  . sodium chloride  3 mL Intravenous Q12H   Continuous Infusions:   Principal Problem:   UTI (urinary tract infection) Active Problems:   HTN (hypertension)   Hypothyroidism   Volume depletion   DNR (do not resuscitate)   Hypotension  Acute encephalopathy   AKI (acute kidney injury)   FTT (failure to thrive) in adult   By signing my name below, I, Burnett Harry attest that this documentation has been prepared under the direction and in the presence of Brendia Sacks, MD Electronically signed: Burnett Harry, Scribe.  02/04/2015  I personally performed the services described in this documentation. All medical record entries made by the scribe were at my direction. I have reviewed the  chart and agree that the record reflects my personal performance and is accurate and complete. Brendia Sacks, MD

## 2015-02-04 NOTE — Discharge Summary (Signed)
Physician Discharge Summary  Catherine Steele [facility] ZOX:[id number] DOB: 03/09/1919 DOA: 02/01/2015  PCP: Catherine Spencer, FNP  Admit date: 02/01/2015 Discharge date: 02/04/2015  Recommendations for Outpatient Follow-up:  1. Follow up with PCP in 1-2 weeks for resolution of UTI 2. Urine Culture pending 3. Possible dementia 4. Reported FTT, poor oral intake. Cozaar on hold. Consider BMP as outpatient.    Follow-up Information    Follow up with Catherine Spencer, FNP. Schedule an appointment as soon as possible for a visit in 1 week.   Specialty:  Nurse Practitioner   Contact information:   [address] [place] [address] [phone]        Discharge Diagnoses:  1. Acute encephalopathy with hallucinations secondary to infeciton. 2. UTI. 3. Hypotension secondary to hypovolemia 4. Dehydration. 5. AKI secondary to poor oral intake, FTT 6. FTT 7. Hypokalemia. 8. HTN.  9. Suspected early dementia  Discharge Condition: Improved Disposition: Discharge to ALF  Diet recommendation: heart healthy  Filed Weights   02/01/15 1858 02/01/15 2303  Weight: 63.504 kg (140 lb) 65 kg (143 lb 4.8 oz)    History of present illness:  88 yof with a history of anxiety and carotid stenosis presented with hypotension and confusion. In ED hypotension responded to fluids. She was noted to have a mildly elevated lactate and leukocytosis. Pt was admitted for further management of hypovolemia and possible sepsis  Hospital Course:  Hypotension resolved with IVF c/w history of poor oral intake. During hospitalization, Cozaar was withheld secondary to the suspicion that it was contributing to the patient's hypotension and AKI. She was treated empirically with IV abx for UTI and transitioned to oral abx upon discharge. Culture is still pending. Acute encephalopathy with hallucinations resolved with IVF and IV abx. Patient now back to baseline, early dementia is suspected. Dehydration and AKI corrected  with aggressive IVFs.   Individual issues as below:  1. Acute encephalopathy with hallucinations, resolved. Secondary to UTI. 2. UTI, clinically resolved, afebrile. On oral abx.  3. Hypotension, secondary to hypovolemia, FTT and Cozaar rather than sepsis (considered on admission but no evidence) is resolved. Hypertensive. UC pending. WBC unremarkable, BC reveals no growth after 3 days.  4. Dehydration. Resolved 5. AKI. Resolved. Continue to hold Cozaar. 6. Hypokalemia. Resolved  7. HTN. Stable.  8. Suspected early dementia. Discussed with POA Catherine Steele, reports patient near baseline. Has moments of confusion but is generally oriented. Moved to ALF a few months ago when she started calling police repeatedly for fictitious intruders.   Consultants:  None  Procedures:  none  Antibiotics:  Zosyn 9/17 >>9/17  Vancomycin 9/17 >>9/17  Rocephin 9/17 >>9/18  Ceftin 9/19>>9/22   Discharge Instructions Discharge Instructions    Activity as tolerated - No restrictions    Complete by:  As directed      Diet - low sodium heart healthy    Complete by:  As directed      Discharge instructions    Complete by:  As directed   Call your physician or seek immediate medical attention for fever, confusion, hallucinations, poor oral intake or worsening of condition.            Current Discharge Medication List    START taking these medications   Details  cefUROXime (CEFTIN) 500 MG tablet Take 1 tablet (500 mg total) by mouth 2 (two) times daily with a meal. Qty: 7 tablet, Refills: 0      CONTINUE these medications which have NOT  CHANGED   Details  ALPRAZolam (XANAX) 0.5 MG tablet Take 1 tablet (0.5 mg total) by mouth 2 (two) times daily as needed for anxiety (for restlessness and agitation). Qty: 60 tablet, Refills: 2    amLODipine (NORVASC) 5 MG tablet TAKE ONE TABLET BY MOUTH ONE  TIME DAILY Qty: 30 tablet, Refills: 3    aspirin 325 MG tablet Take 325 mg by mouth  daily.    atenolol (TENORMIN) 50 MG tablet TAKE 2 TABLETS BY MOUTH DAILY AS DIRECTED Qty: 60 tablet, Refills: 5   Associated Diagnoses: Essential hypertension    Cholecalciferol (VITAMIN D3) 2000 UNITS TABS Take 1 tablet by mouth daily.    cloNIDine (CATAPRES) 0.1 MG tablet TAKE ONE TABLET BY MOUTH THREE TIMES DAILY Qty: 90 tablet, Refills: 3    fluticasone (FLONASE) 50 MCG/ACT nasal spray Place 2 sprays into both nostrils daily. Qty: 16 g, Refills: 6   Associated Diagnoses: Other seasonal allergic rhinitis    levothyroxine (SYNTHROID, LEVOTHROID) 75 MCG tablet TAKE 1 TABLET (75 MCG TOTAL)  BY MOUTH DAILY. Qty: 90 tablet, Refills: 1      STOP taking these medications     losartan (COZAAR) 25 MG tablet        Allergies  Allergen Reactions  . Codeine     Unknown reaction  . Niacin And Related     Unknown reaction    The results of significant diagnostics from this hospitalization (including imaging, microbiology, ancillary and laboratory) are listed below for reference.    Significant Diagnostic Studies: Dg Chest Port 1 View  02/01/2015   CLINICAL DATA:  Weakness for several days. Hypotension. Rule out pneumonia.  EXAM: PORTABLE CHEST - 1 VIEW  COMPARISON:  12/16/2010  FINDINGS: The cardiomediastinal contours are unchanged with stable cardiomegaly. Pulmonary vasculature is normal. No consolidation, pleural effusion, or pneumothorax. No acute osseous abnormalities are seen. There is an old right rib fracture per  IMPRESSION: Unchanged cardiomegaly.  No consolidation to suggest pneumonia.   Electronically Signed   By: Rubye Oaks M.D.   On: 02/01/2015 21:09    Microbiology: Recent Results (from the past 240 hour(s))  Blood Culture (routine x 2)     Status: None (Preliminary result)   Collection Time: 02/01/15  7:30 PM  Result Value Ref Range Status   Specimen Description BLOOD LEFT HAND  Final   Special Requests BOTTLES DRAWN AEROBIC ONLY 6CC  Final   Culture NO GROWTH  3 DAYS  Final   Report Status PENDING  Incomplete  Blood Culture (routine x 2)     Status: None (Preliminary result)   Collection Time: 02/01/15  7:35 PM  Result Value Ref Range Status   Specimen Description BLOOD LEFT HAND  Final   Special Requests BOTTLES DRAWN AEROBIC ONLY 5CC  Final   Culture NO GROWTH 3 DAYS  Final   Report Status PENDING  Incomplete  Urine culture     Status: None (Preliminary result)   Collection Time: 02/01/15  7:45 PM  Result Value Ref Range Status   Specimen Description URINE, CATHETERIZED  Final   Special Requests NONE  Final   Culture   Final    NO GROWTH < 24 HOURS Performed at [facility]    Report Status PENDING  Incomplete     Labs: Basic Metabolic Panel:  Recent Labs Lab 02/01/15 1755 02/01/15 1920 02/01/15 1935 02/02/15 0443 02/03/15 0634  NA 142 139  --  140 143  K 3.4* 2.7*  --  3.3* 4.0  CL 94* 95*  --  102 105  CO2 24 33*  --  31 28  GLUCOSE 127* 136*  --  93 95  BUN 19 22*  --  16 12  CREATININE 1.09* 1.37*  --  1.12* 0.92  CALCIUM 9.8 9.1  --  8.1* 9.0  MG  --   --  1.7  --   --    Liver Function Tests:  Recent Labs Lab 02/01/15 1755 02/01/15 1920  AST 18 20  ALT 12 13*  ALKPHOS 101 95  BILITOT 0.6 0.9  PROT 5.8* 6.3*  ALBUMIN  --  3.4*   CBC:  Recent Labs Lab 02/01/15 1755 02/01/15 1920 02/02/15 0443 02/03/15 0634  WBC 10.2 12.4* 14.3* 13.2*  NEUTROABS 6.4 8.8*  --   --   HGB  --  15.0 13.6 15.0  HCT 46.2 44.9 40.4 45.0  MCV  --  92.0 91.2 92.4  PLT  --  304 274 264   Cardiac Enzymes:  Recent Labs Lab 02/01/15 1920 02/02/15 1411  TROPONINI 0.04* 0.03    Principal Problem:   UTI (urinary tract infection) Active Problems:   HTN (hypertension)   Hypothyroidism   Volume depletion   DNR (do not resuscitate)   Hypotension   Acute encephalopathy   AKI (acute kidney injury)   FTT (failure to thrive) in adult   Time coordinating discharge: 35 minutes  Signed:  Brendia Sacks,  MD [facility] 02/04/2015, 8:11 AM   By signing my name below, I, Burnett Harry attest that this documentation has been prepared under the direction and in the presence of Brendia Sacks, MD Electronically signed: Burnett Harry, Scribe.  02/04/2015  I personally performed the services described in this documentation. All medical record entries made by the scribe were at my direction. I have reviewed the chart and agree that the record reflects my personal performance and is accurate and complete. Brendia Sacks, MD

## 2015-02-04 NOTE — Clinical Social Work Note (Signed)
Clinical Social Work Assessment  Patient Details  Name: Catherine Steele MRN: [id number] Date of Birth: [birth year]  Date of referral:  02/04/15               Reason for consult:  Facility Placement                Permission sought to share information with:  Family Supports Permission granted to share information::  Yes, Verbal Permission Granted  Name::     Biochemist, clinical::     Relationship::  nephew  Contact Information:     Housing/Transportation Living arrangements for the past 2 months:  [place] of Information:  Patient, Other (Comment Required) (family) Patient Interpreter Needed:  None Criminal Activity/Legal Involvement Pertinent to Current Situation/Hospitalization:  No - Comment as needed Significant Relationships:  Other Family Members Lives with:  Facility Resident Do you feel safe going back to the place where you live?  Yes Need for family participation in patient care:  Yes (Comment)  Care giving concerns:  Pt is resident at ALF.    Social Worker assessment / plan:  CSW met with pt at bedside. Pt alert and oriented and reports she recently went to [facility]. She has a niece and nephew who are involved in her care. Pt aware of d/c and agreeable to return to [facility] today. Per Vevelyn Royals, pt's nephew/HCPOA, pt has been at [facility] for about 6 weeks. He explained that pt wasn't feeling safe at home alone and wanted to go to [facility]. She states it is okay, but she is still adjusting. Per Lattie Haw at facility, pt requires assist with all ADLs and ambulates with a walker at baseline. She has been receiving home health PT/RN through [facility]. Okay for return and facility will transport pt this afternoon. D/C summary and FL2 faxed.   Employment status:  Retired Forensic scientist:  Medicare PT Recommendations:  Not assessed at this time [place] / Referral to community resources:  Other (Comment Required) (return to [facility] of  [place])  Patient/Family's Response to care:  Pt/family request return to [facility] [place].   Patient/Family's Understanding of and Emotional Response to Diagnosis, Current Treatment, and Prognosis:  Family appear to have understanding of admission diagnosis and are aware pt is stable for d/c.   Emotional Assessment Appearance:  Appears stated age Attitude/Demeanor/Rapport:  Other (Cooperative) Affect (typically observed):  Appropriate Orientation:  Oriented to Self, Oriented to Place, Oriented to  Time, Oriented to Situation Alcohol / Substance use:  Not Applicable Psych involvement (Current and /or in the community):  No (Comment)  Discharge Needs  Concerns to be addressed:  Discharge Planning Concerns Readmission within the last 30 days:  No Current discharge risk:  None Barriers to Discharge:  No Barriers Identified   Salome Arnt, [place] 02/04/2015, 11:55 AM [phone]

## 2015-02-04 NOTE — Care Management Important Message (Signed)
Important Message  Patient Details  Name: Catherine Steele MRN: [id number] Date of Birth: 07/28/1918   Medicare Important Message Given:  Yes-second notification given    Malcolm Metro, RN 02/04/2015, 11:35 AM

## 2015-02-05 ENCOUNTER — Telehealth: Payer: Self-pay | Admitting: Family

## 2015-02-05 NOTE — Telephone Encounter (Signed)
Pt's BP was low when she was in the office, will address at her follow up

## 2015-02-05 NOTE — Telephone Encounter (Signed)
Please advise 

## 2015-02-06 LAB — CULTURE, BLOOD (ROUTINE X 2)
CULTURE: NO GROWTH
Culture: NO GROWTH

## 2015-02-06 NOTE — Telephone Encounter (Signed)
[employer]  And nephew contacted.  Aware ,patient scheduled to follow up at [facility].

## 2015-02-15 ENCOUNTER — Encounter (INDEPENDENT_AMBULATORY_CARE_PROVIDER_SITE_OTHER): Payer: Medicare Other | Admitting: Family Medicine

## 2015-02-15 DIAGNOSIS — I10 Essential (primary) hypertension: Secondary | ICD-10-CM

## 2015-02-15 DIAGNOSIS — F411 Generalized anxiety disorder: Secondary | ICD-10-CM

## 2015-02-15 DIAGNOSIS — M1990 Unspecified osteoarthritis, unspecified site: Secondary | ICD-10-CM | POA: Diagnosis not present

## 2015-02-15 DIAGNOSIS — F17200 Nicotine dependence, unspecified, uncomplicated: Secondary | ICD-10-CM | POA: Diagnosis not present

## 2015-02-19 ENCOUNTER — Ambulatory Visit (INDEPENDENT_AMBULATORY_CARE_PROVIDER_SITE_OTHER): Payer: Medicare Other | Admitting: Family

## 2015-02-19 ENCOUNTER — Encounter: Payer: Self-pay | Admitting: Family

## 2015-02-19 VITALS — BP 142/69 | HR 66 | Temp 97.1°F | Ht 65.0 in | Wt 138.2 lb

## 2015-02-19 DIAGNOSIS — Z23 Encounter for immunization: Secondary | ICD-10-CM | POA: Diagnosis not present

## 2015-02-19 DIAGNOSIS — N3000 Acute cystitis without hematuria: Secondary | ICD-10-CM | POA: Diagnosis not present

## 2015-02-19 DIAGNOSIS — Z09 Encounter for follow-up examination after completed treatment for conditions other than malignant neoplasm: Secondary | ICD-10-CM

## 2015-02-19 DIAGNOSIS — R300 Dysuria: Secondary | ICD-10-CM

## 2015-02-19 DIAGNOSIS — I959 Hypotension, unspecified: Secondary | ICD-10-CM | POA: Diagnosis not present

## 2015-02-19 NOTE — Patient Instructions (Signed)

## 2015-02-19 NOTE — Progress Notes (Signed)
   Subjective:    Patient ID: Catherine Steele, female    DOB: 1918/11/16, 81 y.o.   MRN: [id number]  HPI Pt presents to the office today for hospital follow up. Pt was sent to the ED for hypotension and possible sepsis from a UTI. Pt was admitted and was given hydration and started on antibiotics. Pt reports feeling "a lot better". Pt denies any hallucination and is alert and oriented.    Review of Systems  Constitutional: Negative.   HENT: Negative.   Eyes: Negative.   Respiratory: Negative.  Negative for shortness of breath.   Cardiovascular: Negative.  Negative for palpitations.  Gastrointestinal: Negative.   Endocrine: Negative.   Genitourinary: Negative.   Musculoskeletal: Negative.   Neurological: Negative.  Negative for headaches.  Hematological: Negative.   Psychiatric/Behavioral: Negative.   All other systems reviewed and are negative.      Objective:   Physical Exam  Constitutional: She is oriented to person, place, and time. She appears well-developed and well-nourished. No distress.  HENT:  Head: Normocephalic and atraumatic.  Eyes: Pupils are equal, round, and reactive to light.  Neck: Normal range of motion. Neck supple. No thyromegaly present.  Cardiovascular: Normal rate, regular rhythm, normal heart sounds and intact distal pulses.   No murmur heard. Pulmonary/Chest: Effort normal and breath sounds normal. No respiratory distress. She has no wheezes.  Abdominal: Soft. Bowel sounds are normal. She exhibits no distension. There is no tenderness.  Musculoskeletal: Normal range of motion. She exhibits no edema or tenderness.  Pt in wheelchair   Neurological: She is alert and oriented to person, place, and time. She has normal reflexes. No cranial nerve deficit.  Skin: Skin is warm and dry. There is pallor.  Psychiatric: She has a normal mood and affect. Her behavior is normal. Judgment and thought content normal.  Vitals reviewed.   BP 142/69 mmHg  Pulse 66   Temp(Src) 97.1 F (36.2 C) (Oral)  Ht $R'5\' 5"'NN$  (1.651 m)  Wt 138 lb 3.2 oz (62.687 kg)  BMI 23.00 kg/m2       Assessment & Plan:  1. Acute cystitis without hematuria -Force fluids AZO over the counter X2 days RTO prn Culture pending - BMP8+EGFR - POCT urinalysis dipstick - POCT UA - Microscopic Only  2. Hypotension, unspecified hypotension type -Has resolved- Pt to continue with current BP medication - BMP8+EGFR  3. Hospital discharge follow-up - BMP8+EGFR  Flu vaccine given today  Evelina Dun, FNP

## 2015-02-20 ENCOUNTER — Other Ambulatory Visit: Payer: Medicare Other

## 2015-02-20 ENCOUNTER — Other Ambulatory Visit: Payer: Self-pay | Admitting: *Deleted

## 2015-02-20 LAB — BMP8+EGFR
BUN / CREAT RATIO: 17 (ref 11–26)
BUN: 15 mg/dL (ref 10–36)
CHLORIDE: 97 mmol/L (ref 97–108)
CO2: 32 mmol/L — ABNORMAL HIGH (ref 18–29)
CREATININE: 0.89 mg/dL (ref 0.57–1.00)
Calcium: 9.4 mg/dL (ref 8.7–10.3)
GFR calc non Af Amer: 56 mL/min/{1.73_m2} — ABNORMAL LOW (ref >59)
GFR, EST AFRICAN AMERICAN: 64 mL/min/{1.73_m2} (ref >59)
GLUCOSE: 105 mg/dL — AB (ref 65–99)
Potassium: 3.2 mmol/L — ABNORMAL LOW (ref 3.5–5.2)
SODIUM: 141 mmol/L (ref 134–144)

## 2015-02-20 LAB — POCT UA - MICROSCOPIC ONLY
CASTS, UR, LPF, POC: NEGATIVE
CRYSTALS, UR, HPF, POC: NEGATIVE
MUCUS UA: NEGATIVE
RBC, urine, microscopic: NEGATIVE
YEAST UA: NEGATIVE

## 2015-02-20 LAB — POCT URINALYSIS DIPSTICK
BILIRUBIN UA: NEGATIVE
Glucose, UA: NEGATIVE
KETONES UA: NEGATIVE
Nitrite, UA: NEGATIVE
SPEC GRAV UA: 1.025
Urobilinogen, UA: NEGATIVE
pH, UA: 6

## 2015-02-20 MED ORDER — ALPRAZOLAM 0.5 MG PO TABS: 0.5000 mg | ORAL_TABLET | Freq: Two times a day (BID) | ORAL | Status: AC | PRN

## 2015-02-20 NOTE — Addendum Note (Signed)
Addended by: Tommas Olp on: 02/20/2015 09:19 AM   Modules accepted: Orders

## 2015-02-21 ENCOUNTER — Other Ambulatory Visit: Payer: Self-pay | Admitting: Family

## 2015-02-21 MED ORDER — SULFAMETHOXAZOLE-TRIMETHOPRIM 800-160 MG PO TABS: 1.0000 | ORAL_TABLET | Freq: Two times a day (BID) | ORAL | Status: DC

## 2015-02-22 ENCOUNTER — Ambulatory Visit (INDEPENDENT_AMBULATORY_CARE_PROVIDER_SITE_OTHER): Payer: Medicare Other | Admitting: Family

## 2015-02-22 ENCOUNTER — Encounter: Payer: Self-pay | Admitting: Family

## 2015-02-22 VITALS — BP 159/80 | HR 61 | Temp 97.9°F

## 2015-02-22 DIAGNOSIS — J3489 Other specified disorders of nose and nasal sinuses: Secondary | ICD-10-CM | POA: Diagnosis not present

## 2015-02-22 LAB — URINE CULTURE

## 2015-02-22 NOTE — Progress Notes (Signed)
   Subjective:    Patient ID: Catherine Steele, female    DOB: 01-Mar-1919, 81 y.o.   MRN: [id number]  HPI Pt presents to the office today for a blockage in her right nare. Pt is a resident at [facility] and the caregiver has stated that she can not use her nose spray because of the blockage. Pt states she is able to pass air through her right nare, but it "wheezes".    Review of Systems  Constitutional: Negative.   HENT: Negative.   Eyes: Negative.   Respiratory: Negative.  Negative for shortness of breath.   Cardiovascular: Negative.  Negative for palpitations.  Gastrointestinal: Negative.   Endocrine: Negative.   Genitourinary: Negative.   Musculoskeletal: Negative.   Neurological: Negative.  Negative for headaches.  Hematological: Negative.   Psychiatric/Behavioral: Negative.   All other systems reviewed and are negative.      Objective:   Physical Exam  Constitutional: She is oriented to person, place, and time. She appears well-developed and well-nourished. No distress.  HENT:  Head: Normocephalic and atraumatic.  Large dried scab removed out of right and left nare   Eyes: Pupils are equal, round, and reactive to light.  Cardiovascular: Normal rate, regular rhythm, normal heart sounds and intact distal pulses.   No murmur heard. Pulmonary/Chest: Effort normal. No respiratory distress. She has no wheezes.  Diminished breath sounds   Abdominal: Soft. Bowel sounds are normal. She exhibits no distension. There is no tenderness.  Neurological: She is alert and oriented to person, place, and time.  Skin: Skin is warm and dry.  Psychiatric: She has a normal mood and affect. Her behavior is normal. Judgment and thought content normal.  Vitals reviewed.   BP 159/80 mmHg  Pulse 61  Temp(Src) 97.9 F (36.6 C) (Oral)  Wt        Assessment & Plan:  1. Nasal obstruction -Keep nose clean and dry -Do not pick at nose -RTO prn  [name], FNP

## 2015-02-22 NOTE — Patient Instructions (Signed)
Health Maintenance, Female Adopting a healthy lifestyle and getting preventive care can go a long way to promote health and wellness. Talk with your health care provider about what schedule of regular examinations is right for you. This is a good chance for you to check in with your provider about disease prevention and staying healthy. In between checkups, there are plenty of things you can do on your own. Experts have done a lot of research about which lifestyle changes and preventive measures are most likely to keep you healthy. Ask your health care provider for more information. WEIGHT AND DIET  Eat a healthy diet  Be sure to include plenty of vegetables, fruits, low-fat dairy products, and lean protein.  Do not eat a lot of foods high in solid fats, added sugars, or salt.  Get regular exercise. This is one of the most important things you can do for your health.  Most adults should exercise for at least 150 minutes each week. The exercise should increase your heart rate and make you sweat (moderate-intensity exercise).  Most adults should also do strengthening exercises at least twice a week. This is in addition to the moderate-intensity exercise.  Maintain a healthy weight  Body mass index (BMI) is a measurement that can be used to identify possible weight problems. It estimates body fat based on height and weight. Your health care provider can help determine your BMI and help you achieve or maintain a healthy weight.  For females 20 years of age and older:   A BMI below 18.5 is considered underweight.  A BMI of 18.5 to 24.9 is normal.  A BMI of 25 to 29.9 is considered overweight.  A BMI of 30 and above is considered obese.  Watch levels of cholesterol and blood lipids  You should start having your blood tested for lipids and cholesterol at 79 years of age, then have this test every 5 years.  You may need to have your cholesterol levels checked more often if:  Your lipid  or cholesterol levels are high.  You are older than 79 years of age.  You are at high risk for heart disease.  CANCER SCREENING   Lung Cancer  Lung cancer screening is recommended for adults 55-80 years old who are at high risk for lung cancer because of a history of smoking.  A yearly low-dose CT scan of the lungs is recommended for people who:  Currently smoke.  Have quit within the past 15 years.  Have at least a 30-pack-year history of smoking. A pack year is smoking an average of one pack of cigarettes a day for 1 year.  Yearly screening should continue until it has been 15 years since you quit.  Yearly screening should stop if you develop a health problem that would prevent you from having lung cancer treatment.  Breast Cancer  Practice breast self-awareness. This means understanding how your breasts normally appear and feel.  It also means doing regular breast self-exams. Let your health care provider know about any changes, no matter how small.  If you are in your 20s or 30s, you should have a clinical breast exam (CBE) by a health care provider every 1-3 years as part of a regular health exam.  If you are 40 or older, have a CBE every year. Also consider having a breast X-ray (mammogram) every year.  If you have a family history of breast cancer, talk to your health care provider about genetic screening.  If you   are at high risk for breast cancer, talk to your health care provider about having an MRI and a mammogram every year.  Breast cancer gene (BRCA) assessment is recommended for women who have family members with BRCA-related cancers. BRCA-related cancers include:  Breast.  Ovarian.  Tubal.  Peritoneal cancers.  Results of the assessment will determine the need for genetic counseling and BRCA1 and BRCA2 testing. Cervical Cancer Your health care provider may recommend that you be screened regularly for cancer of the pelvic organs (ovaries, uterus, and  vagina). This screening involves a pelvic examination, including checking for microscopic changes to the surface of your cervix (Pap test). You may be encouraged to have this screening done every 3 years, beginning at age 21.  For women ages 30-65, health care providers may recommend pelvic exams and Pap testing every 3 years, or they may recommend the Pap and pelvic exam, combined with testing for human papilloma virus (HPV), every 5 years. Some types of HPV increase your risk of cervical cancer. Testing for HPV may also be done on women of any age with unclear Pap test results.  Other health care providers may not recommend any screening for nonpregnant women who are considered low risk for pelvic cancer and who do not have symptoms. Ask your health care provider if a screening pelvic exam is right for you.  If you have had past treatment for cervical cancer or a condition that could lead to cancer, you need Pap tests and screening for cancer for at least 20 years after your treatment. If Pap tests have been discontinued, your risk factors (such as having a new sexual partner) need to be reassessed to determine if screening should resume. Some women have medical problems that increase the chance of getting cervical cancer. In these cases, your health care provider may recommend more frequent screening and Pap tests. Colorectal Cancer  This type of cancer can be detected and often prevented.  Routine colorectal cancer screening usually begins at 79 years of age and continues through 79 years of age.  Your health care provider may recommend screening at an earlier age if you have risk factors for colon cancer.  Your health care provider may also recommend using home test kits to check for hidden blood in the stool.  A small camera at the end of a tube can be used to examine your colon directly (sigmoidoscopy or colonoscopy). This is done to check for the earliest forms of colorectal  cancer.  Routine screening usually begins at age 50.  Direct examination of the colon should be repeated every 5-10 years through 79 years of age. However, you may need to be screened more often if early forms of precancerous polyps or small growths are found. Skin Cancer  Check your skin from head to toe regularly.  Tell your health care provider about any new moles or changes in moles, especially if there is a change in a mole's shape or color.  Also tell your health care provider if you have a mole that is larger than the size of a pencil eraser.  Always use sunscreen. Apply sunscreen liberally and repeatedly throughout the day.  Protect yourself by wearing long sleeves, pants, a wide-brimmed hat, and sunglasses whenever you are outside. HEART DISEASE, DIABETES, AND HIGH BLOOD PRESSURE   High blood pressure causes heart disease and increases the risk of stroke. High blood pressure is more likely to develop in:  People who have blood pressure in the high end   of the normal range (130-139/85-89 mm Hg).  People who are overweight or obese.  People who are African American.  If you are 38-23 years of age, have your blood pressure checked every 3-5 years. If you are 61 years of age or older, have your blood pressure checked every year. You should have your blood pressure measured twice--once when you are at a hospital or clinic, and once when you are not at a hospital or clinic. Record the average of the two measurements. To check your blood pressure when you are not at a hospital or clinic, you can use:  An automated blood pressure machine at a pharmacy.  A home blood pressure monitor.  If you are between 45 years and 39 years old, ask your health care provider if you should take aspirin to prevent strokes.  Have regular diabetes screenings. This involves taking a blood sample to check your fasting blood sugar level.  If you are at a normal weight and have a low risk for diabetes,  have this test once every three years after 79 years of age.  If you are overweight and have a high risk for diabetes, consider being tested at a younger age or more often. PREVENTING INFECTION  Hepatitis B  If you have a higher risk for hepatitis B, you should be screened for this virus. You are considered at high risk for hepatitis B if:  You were born in a country where hepatitis B is common. Ask your health care provider which countries are considered high risk.  Your parents were born in a high-risk country, and you have not been immunized against hepatitis B (hepatitis B vaccine).  You have HIV or AIDS.  You use needles to inject street drugs.  You live with someone who has hepatitis B.  You have had sex with someone who has hepatitis B.  You get hemodialysis treatment.  You take certain medicines for conditions, including cancer, organ transplantation, and autoimmune conditions. Hepatitis C  Blood testing is recommended for:  Everyone born from [birth year] through 1963.  Anyone with known risk factors for hepatitis C. Sexually transmitted infections (STIs)  You should be screened for sexually transmitted infections (STIs) including gonorrhea and chlamydia if:  You are sexually active and are younger than 79 years of age.  You are older than 79 years of age and your health care provider tells you that you are at risk for this type of infection.  Your sexual activity has changed since you were last screened and you are at an increased risk for chlamydia or gonorrhea. Ask your health care provider if you are at risk.  If you do not have HIV, but are at risk, it may be recommended that you take a prescription medicine daily to prevent HIV infection. This is called pre-exposure prophylaxis (PrEP). You are considered at risk if:  You are sexually active and do not regularly use condoms or know the HIV status of your partner(s).  You take drugs by injection.  You are sexually  active with a partner who has HIV. Talk with your health care provider about whether you are at high risk of being infected with HIV. If you choose to begin PrEP, you should first be tested for HIV. You should then be tested every 3 months for as long as you are taking PrEP.  PREGNANCY   If you are premenopausal and you may become pregnant, ask your health care provider about preconception counseling.  If you may  become pregnant, take 400 to 800 micrograms (mcg) of folic acid every day.  If you want to prevent pregnancy, talk to your health care provider about birth control (contraception). OSTEOPOROSIS AND MENOPAUSE   Osteoporosis is a disease in which the bones lose minerals and strength with aging. This can result in serious bone fractures. Your risk for osteoporosis can be identified using a bone density scan.  If you are 61 years of age or older, or if you are at risk for osteoporosis and fractures, ask your health care provider if you should be screened.  Ask your health care provider whether you should take a calcium or vitamin D supplement to lower your risk for osteoporosis.  Menopause may have certain physical symptoms and risks.  Hormone replacement therapy may reduce some of these symptoms and risks. Talk to your health care provider about whether hormone replacement therapy is right for you.  HOME CARE INSTRUCTIONS   Schedule regular health, dental, and eye exams.  Stay current with your immunizations.   Do not use any tobacco products including cigarettes, chewing tobacco, or electronic cigarettes.  If you are pregnant, do not drink alcohol.  If you are breastfeeding, limit how much and how often you drink alcohol.  Limit alcohol intake to no more than 1 drink per day for nonpregnant women. One drink equals 12 ounces of beer, 5 ounces of wine, or 1 ounces of hard liquor.  Do not use street drugs.  Do not share needles.  Ask your health care provider for help if  you need support or information about quitting drugs.  Tell your health care provider if you often feel depressed.  Tell your health care provider if you have ever been abused or do not feel safe at home.   This information is not intended to replace advice given to you by your health care provider. Make sure you discuss any questions you have with your health care provider.   Document Released: 11/17/2010 Document Revised: 05/25/2014 Document Reviewed: 04/05/2013 Elsevier Interactive Patient Education [employer].

## 2015-03-01 ENCOUNTER — Telehealth: Payer: Self-pay | Admitting: Family Medicine

## 2015-03-01 NOTE — Telephone Encounter (Signed)
Verbal order given per Dr.Moore 

## 2015-03-19 ENCOUNTER — Telehealth: Payer: Self-pay | Admitting: Family

## 2015-03-19 MED ORDER — HYDROCORTISONE 2.5 % EX CREA: TOPICAL_CREAM | Freq: Two times a day (BID) | CUTANEOUS | Status: DC

## 2015-03-19 NOTE — Telephone Encounter (Signed)
Prescription sent to pharmacy.

## 2015-04-05 ENCOUNTER — Telehealth: Payer: Self-pay | Admitting: Family Medicine

## 2015-04-08 NOTE — Telephone Encounter (Signed)
Verbal order given per Dr. Christell ConstantMoore for seat cushion

## 2015-04-09 ENCOUNTER — Emergency Department (HOSPITAL_COMMUNITY): Payer: Medicare Other

## 2015-04-09 ENCOUNTER — Encounter (HOSPITAL_COMMUNITY): Payer: Self-pay | Admitting: Emergency Medicine

## 2015-04-09 ENCOUNTER — Inpatient Hospital Stay (HOSPITAL_COMMUNITY)
Admission: EM | Admit: 2015-04-09 | Discharge: 2015-04-15 | DRG: 682 | Disposition: A | Discharge status: Hospice | Payer: Medicare Other | Attending: Internal Medicine | Admitting: Internal Medicine

## 2015-04-09 DIAGNOSIS — N179 Acute kidney failure, unspecified: Principal | ICD-10-CM

## 2015-04-09 DIAGNOSIS — E860 Dehydration: Secondary | ICD-10-CM

## 2015-04-09 DIAGNOSIS — R5381 Other malaise: Secondary | ICD-10-CM | POA: Diagnosis present

## 2015-04-09 DIAGNOSIS — E870 Hyperosmolality and hypernatremia: Secondary | ICD-10-CM | POA: Diagnosis present

## 2015-04-09 DIAGNOSIS — Z515 Encounter for palliative care: Secondary | ICD-10-CM | POA: Diagnosis not present

## 2015-04-09 DIAGNOSIS — Z66 Do not resuscitate: Secondary | ICD-10-CM | POA: Diagnosis present

## 2015-04-09 DIAGNOSIS — E039 Hypothyroidism, unspecified: Secondary | ICD-10-CM | POA: Diagnosis present

## 2015-04-09 DIAGNOSIS — E876 Hypokalemia: Secondary | ICD-10-CM

## 2015-04-09 DIAGNOSIS — R778 Other specified abnormalities of plasma proteins: Secondary | ICD-10-CM | POA: Diagnosis present

## 2015-04-09 DIAGNOSIS — L8990 Pressure ulcer of unspecified site, unspecified stage: Secondary | ICD-10-CM | POA: Diagnosis present

## 2015-04-09 DIAGNOSIS — R6510 Systemic inflammatory response syndrome (SIRS) of non-infectious origin without acute organ dysfunction: Secondary | ICD-10-CM | POA: Diagnosis present

## 2015-04-09 DIAGNOSIS — F0390 Unspecified dementia without behavioral disturbance: Secondary | ICD-10-CM | POA: Diagnosis present

## 2015-04-09 DIAGNOSIS — F419 Anxiety disorder, unspecified: Secondary | ICD-10-CM | POA: Diagnosis present

## 2015-04-09 DIAGNOSIS — I10 Essential (primary) hypertension: Secondary | ICD-10-CM | POA: Diagnosis present

## 2015-04-09 DIAGNOSIS — G9340 Encephalopathy, unspecified: Secondary | ICD-10-CM | POA: Diagnosis present

## 2015-04-09 DIAGNOSIS — R7989 Other specified abnormal findings of blood chemistry: Secondary | ICD-10-CM

## 2015-04-09 DIAGNOSIS — E785 Hyperlipidemia, unspecified: Secondary | ICD-10-CM | POA: Diagnosis present

## 2015-04-09 DIAGNOSIS — R32 Unspecified urinary incontinence: Secondary | ICD-10-CM | POA: Diagnosis present

## 2015-04-09 DIAGNOSIS — Z7982 Long term (current) use of aspirin: Secondary | ICD-10-CM

## 2015-04-09 DIAGNOSIS — M1990 Unspecified osteoarthritis, unspecified site: Secondary | ICD-10-CM | POA: Diagnosis present

## 2015-04-09 DIAGNOSIS — R531 Weakness: Secondary | ICD-10-CM | POA: Diagnosis not present

## 2015-04-09 DIAGNOSIS — R627 Adult failure to thrive: Secondary | ICD-10-CM | POA: Diagnosis present

## 2015-04-09 HISTORY — DX: Do not resuscitate: Z66

## 2015-04-09 HISTORY — DX: Disorientation, unspecified: R41.0

## 2015-04-09 LAB — COMPREHENSIVE METABOLIC PANEL
ALBUMIN: 3.2 g/dL — AB (ref 3.5–5.0)
ALT: 27 U/L (ref 14–54)
AST: 36 U/L (ref 15–41)
Alkaline Phosphatase: 78 U/L (ref 38–126)
Anion gap: 16 — ABNORMAL HIGH (ref 5–15)
BUN: 57 mg/dL — AB (ref 6–20)
CHLORIDE: 97 mmol/L — AB (ref 101–111)
CO2: 33 mmol/L — AB (ref 22–32)
CREATININE: 1.84 mg/dL — AB (ref 0.44–1.00)
Calcium: 9.8 mg/dL (ref 8.9–10.3)
GFR calc Af Amer: 26 mL/min — ABNORMAL LOW (ref >60)
GFR, EST NON AFRICAN AMERICAN: 22 mL/min — AB (ref >60)
GLUCOSE: 107 mg/dL — AB (ref 65–99)
Potassium: 2.9 mmol/L — ABNORMAL LOW (ref 3.5–5.1)
Sodium: 146 mmol/L — ABNORMAL HIGH (ref 135–145)
Total Bilirubin: 2.3 mg/dL — ABNORMAL HIGH (ref 0.3–1.2)
Total Protein: 6.2 g/dL — ABNORMAL LOW (ref 6.5–8.1)

## 2015-04-09 LAB — CBC WITH DIFFERENTIAL/PLATELET
BASOS ABS: 0 10*3/uL (ref 0.0–0.1)
Basophils Relative: 0 %
EOS PCT: 0 %
Eosinophils Absolute: 0 10*3/uL (ref 0.0–0.7)
HCT: 51.2 % — ABNORMAL HIGH (ref 36.0–46.0)
Hemoglobin: 17 g/dL — ABNORMAL HIGH (ref 12.0–15.0)
LYMPHS PCT: 14 %
Lymphs Abs: 2.3 10*3/uL (ref 0.7–4.0)
MCH: 30.6 pg (ref 26.0–34.0)
MCHC: 33.2 g/dL (ref 30.0–36.0)
MCV: 92.1 fL (ref 78.0–100.0)
Monocytes Absolute: 1.5 10*3/uL — ABNORMAL HIGH (ref 0.1–1.0)
Monocytes Relative: 9 %
NEUTROS ABS: 12.8 10*3/uL — AB (ref 1.7–7.7)
Neutrophils Relative %: 77 %
PLATELETS: 254 10*3/uL (ref 150–400)
RBC: 5.56 MIL/uL — AB (ref 3.87–5.11)
RDW: 15.2 % (ref 11.5–15.5)
WBC: 16.5 10*3/uL — AB (ref 4.0–10.5)

## 2015-04-09 LAB — MRSA PCR SCREENING: MRSA by PCR: NEGATIVE

## 2015-04-09 LAB — TROPONIN I
TROPONIN I: 0.11 ng/mL — AB (ref <0.031)
TROPONIN I: 0.09 ng/mL — AB (ref <0.031)

## 2015-04-09 LAB — LACTIC ACID, PLASMA
LACTIC ACID, VENOUS: 1.9 mmol/L (ref 0.5–2.0)
Lactic Acid, Venous: 2.4 mmol/L (ref 0.5–2.0)
Lactic Acid, Venous: 2 mmol/L (ref 0.5–2.0)

## 2015-04-09 LAB — MAGNESIUM: Magnesium: 2.3 mg/dL (ref 1.7–2.4)

## 2015-04-09 MED ORDER — ATENOLOL 25 MG PO TABS
100.0000 mg | ORAL_TABLET | Freq: Every day | ORAL | Status: DC
  Administered 2015-04-12 – 2015-04-13 (×2): 100 mg via ORAL
  Administered 2015-04-14: 25 mg via ORAL
  Administered 2015-04-15: 100 mg via ORAL
  Filled 2015-04-09 (×6): qty 4

## 2015-04-09 MED ORDER — ACETAMINOPHEN 650 MG RE SUPP
650.0000 mg | Freq: Four times a day (QID) | RECTAL | Status: DC | PRN
  Filled 2015-04-09 (×2): qty 1

## 2015-04-09 MED ORDER — ASPIRIN 325 MG PO TABS
325.0000 mg | ORAL_TABLET | Freq: Every day | ORAL | Status: DC
  Administered 2015-04-11 – 2015-04-15 (×5): 325 mg via ORAL
  Filled 2015-04-09 (×7): qty 1

## 2015-04-09 MED ORDER — ONDANSETRON HCL 4 MG/2ML IJ SOLN: 4.0000 mg | Freq: Four times a day (QID) | INTRAMUSCULAR | Status: DC | PRN

## 2015-04-09 MED ORDER — DEXTROSE 5 % IV SOLN
1.0000 g | INTRAVENOUS | Status: DC
  Administered 2015-04-09 – 2015-04-11 (×3): 1 g via INTRAVENOUS
  Filled 2015-04-09 (×2): qty 10

## 2015-04-09 MED ORDER — SODIUM CHLORIDE 0.9 % IJ SOLN
3.0000 mL | Freq: Two times a day (BID) | INTRAMUSCULAR | Status: DC
  Administered 2015-04-09 – 2015-04-15 (×5): 3 mL via INTRAVENOUS

## 2015-04-09 MED ORDER — CEFTRIAXONE SODIUM 1 G IJ SOLR
INTRAMUSCULAR | Status: AC
  Filled 2015-04-09: qty 10

## 2015-04-09 MED ORDER — POTASSIUM CHLORIDE 10 MEQ/100ML IV SOLN
10.0000 meq | INTRAVENOUS | Status: AC
  Administered 2015-04-09 (×5): 10 meq via INTRAVENOUS
  Filled 2015-04-09 (×2): qty 100

## 2015-04-09 MED ORDER — ONDANSETRON HCL 4 MG PO TABS: 4.0000 mg | ORAL_TABLET | Freq: Four times a day (QID) | ORAL | Status: DC | PRN

## 2015-04-09 MED ORDER — AMLODIPINE BESYLATE 5 MG PO TABS
5.0000 mg | ORAL_TABLET | Freq: Every day | ORAL | Status: DC
  Administered 2015-04-11 – 2015-04-15 (×5): 5 mg via ORAL
  Filled 2015-04-09 (×6): qty 1

## 2015-04-09 MED ORDER — ALPRAZOLAM 0.5 MG PO TABS: 0.5000 mg | ORAL_TABLET | Freq: Two times a day (BID) | ORAL | Status: DC | PRN

## 2015-04-09 MED ORDER — LEVOTHYROXINE SODIUM 75 MCG PO TABS
75.0000 ug | ORAL_TABLET | Freq: Every day | ORAL | Status: DC
  Administered 2015-04-12 – 2015-04-15 (×4): 75 ug via ORAL
  Filled 2015-04-09 (×6): qty 1

## 2015-04-09 MED ORDER — HEPARIN SODIUM (PORCINE) 5000 UNIT/ML IJ SOLN
5000.0000 [IU] | Freq: Three times a day (TID) | INTRAMUSCULAR | Status: DC
  Administered 2015-04-09 – 2015-04-15 (×16): 5000 [IU] via SUBCUTANEOUS
  Filled 2015-04-09 (×16): qty 1

## 2015-04-09 MED ORDER — SODIUM CHLORIDE 0.9 % IV BOLUS (SEPSIS)
1000.0000 mL | Freq: Once | INTRAVENOUS | Status: AC
  Administered 2015-04-09: 1000 mL via INTRAVENOUS

## 2015-04-09 MED ORDER — SODIUM CHLORIDE 0.9 % IV SOLN
INTRAVENOUS | Status: DC
  Administered 2015-04-09: 16:00:00 via INTRAVENOUS

## 2015-04-09 MED ORDER — CLONIDINE HCL 0.1 MG PO TABS
0.1000 mg | ORAL_TABLET | Freq: Three times a day (TID) | ORAL | Status: DC
  Administered 2015-04-09 – 2015-04-15 (×15): 0.1 mg via ORAL
  Filled 2015-04-09 (×16): qty 1

## 2015-04-09 MED ORDER — POTASSIUM CHLORIDE 10 MEQ/100ML IV SOLN
10.0000 meq | Freq: Once | INTRAVENOUS | Status: AC
  Administered 2015-04-09: 10 meq via INTRAVENOUS
  Filled 2015-04-09: qty 100

## 2015-04-09 MED ORDER — SODIUM CHLORIDE 0.9 % IV SOLN: INTRAVENOUS | Status: DC

## 2015-04-09 MED ORDER — ACETAMINOPHEN 325 MG PO TABS: 650.0000 mg | ORAL_TABLET | Freq: Four times a day (QID) | ORAL | Status: DC | PRN

## 2015-04-09 MED ORDER — SODIUM CHLORIDE 0.45 % IV SOLN
INTRAVENOUS | Status: DC
Start: 2015-04-09 — End: 2015-04-10
  Administered 2015-04-10: 10:00:00 via INTRAVENOUS

## 2015-04-09 MED ORDER — SODIUM CHLORIDE 0.9 % IV BOLUS (SEPSIS)
250.0000 mL | Freq: Once | INTRAVENOUS | Status: AC
  Administered 2015-04-09: 250 mL via INTRAVENOUS

## 2015-04-09 MED ORDER — ARIPIPRAZOLE 2 MG PO TABS
2.0000 mg | ORAL_TABLET | Freq: Every day | ORAL | Status: DC
  Administered 2015-04-12 – 2015-04-15 (×4): 2 mg via ORAL
  Filled 2015-04-09 (×8): qty 1

## 2015-04-09 NOTE — ED Notes (Signed)
CRITICAL VALUE ALERT  Critical value received:  Lactic acid 2.4  Date of notification:  04/09/2015  Time of notification:  1608  Critical value read back:Yes.    Nurse who received alert:  [facility]  MD notified (1st page):  Dr. Clarene DukeMcManus  Time of first page:  1608  MD notified (2nd page):  Time of second page:  Responding MD:  Dr. Clarene DukeMcManus  Time MD responded:  [phone]

## 2015-04-09 NOTE — ED Notes (Signed)
MD Merrell at bedside. 

## 2015-04-09 NOTE — H&P (Signed)
[facility] History and Physical  Xcaret J Sedgwick ZOX:[id number] DOB: March 16, 1919 DOA: 04/09/2015  Referring physician: Dr Clarene DukeMcManus - [facility] PCP: Jannifer Rodneyhristy Hawks, FNP   Norva RiffleChief Complaint: fatigue  HPI: Norva Rifflernestine J Steele is a 81 y.o. female  Level V caveat: History limited due to patient's baseline dementia and worsening altered state due to illness  Per report by ED physician, nursing home, and patient's healthcare POA - Karl LukeBritt Wilkinson, patient was in her normal state of health up to a couple of days ago when she began to show evidence of generalized fatigue, weakness and very little oral intake. Patient is without focal complaint but does complain of generalized pain. Patient is confused at baseline but this has worsened over the last several days. Symptoms are constant and getting worse. Of note per Moshe CiproBritt, patient has had a market mental decline over the last 5-6 months.   symptoms are constant and getting worse. Nothing makes it better or worse.   Review of Systems:  Unable to obtain further ROS due to pt mental status   Past Medical History  Diagnosis Date  . Anxiety   . Hyperlipidemia   . Vertigo   . Carotid stenosis   . Vitamin D deficiency   . Hypertension   . Neuromuscular disorder (HCC)     post herpetic neuralgia  . Gallstones   . Arthritis   . Carotid stenosis   . Vitamin D deficiency   . DNR (do not resuscitate)   . Confusion    History reviewed. No pertinent past surgical history. Social History:  reports that she has never smoked. She has never used smokeless tobacco. She reports that she does not drink alcohol or use illicit drugs.  Allergies  Allergen Reactions  . Codeine     Unknown reaction  . Niacin And Related     Unknown reaction    No family history on file.   Prior to Admission medications   Medication Sig Start Date End Date Taking? Authorizing Provider  ALPRAZolam Prudy Feeler(XANAX) 0.5 MG tablet Take 1 tablet (0.5 mg total) by mouth 2 (two) times  daily as needed for anxiety (for restlessness and agitation). 02/20/15  Yes Junie Spencerhristy A Hawks, FNP  amLODipine (NORVASC) 5 MG tablet TAKE ONE TABLET BY MOUTH ONE  TIME DAILY 08/20/14  Yes Junie Spencerhristy A Hawks, FNP  ARIPiprazole (ABILIFY) 2 MG tablet Take 2 mg by mouth daily.   Yes Historical Provider, MD  aspirin 325 MG tablet Take 325 mg by mouth daily.   Yes Historical Provider, MD  atenolol (TENORMIN) 50 MG tablet TAKE 2 TABLETS BY MOUTH DAILY AS DIRECTED Patient taking differently: Take 100 mg by mouth daily.  08/14/14  Yes Junie Spencerhristy A Hawks, FNP  Cholecalciferol (VITAMIN D3) 2000 UNITS TABS Take 1 tablet by mouth daily.   Yes Historical Provider, MD  cloNIDine (CATAPRES) 0.1 MG tablet TAKE ONE TABLET BY MOUTH THREE TIMES DAILY 08/27/14  Yes Junie Spencerhristy A Hawks, FNP  fluticasone (FLONASE) 50 MCG/ACT nasal spray Place 2 sprays into both nostrils daily. 02/23/14  Yes Junie Spencerhristy A Hawks, FNP  levothyroxine (SYNTHROID, LEVOTHROID) 75 MCG tablet TAKE 1 TABLET (75 MCG TOTAL)  BY MOUTH DAILY. 11/27/14  Yes Junie Spencerhristy A Hawks, FNP  hydrocortisone 2.5 % cream Apply topically 2 (two) times daily. Let patient know when rx is ready for pick up Patient not taking: Reported on 04/09/2015 03/19/15   Junie Spencerhristy A Hawks, FNP  sulfamethoxazole-trimethoprim (BACTRIM DS,SEPTRA DS) 800-160 MG tablet Take 1 tablet by mouth 2 (two) times  daily. Patient not taking: Reported on 04/09/2015 02/21/15   Junie Spencer, FNP   Physical Exam: Filed Vitals:   04/09/15 1530 04/09/15 1630 04/09/15 1700 04/09/15 1730  BP: 138/72 159/51 152/63 123/60  Pulse: 64 65 66 65  Temp:      TempSrc:      Resp: SpO2: 100% 100% 100% 100%    Wt Readings from Last 3 Encounters:  02/19/15 62.687 kg (138 lb 3.2 oz)  02/01/15 65 kg (143 lb 4.8 oz)  02/01/15 63.504 kg (140 lb)    General: Elderly, frail, no acute distress  Eyes:  PERRL, EOMI, normal lids, iris ENT: EXTREMELY DRY MUCOUS MEMBRANES  Neck:  no LAD, masses or  thyromegaly Cardiovascular:  RRR, IV/VI Holosystolic murmur. No LE edema.  Respiratory:  CTA bilaterally, no w/r/r. Normal respiratory effort. Abdomen:  soft, ntnd Skin:  no rash or induration seen on limited exam Musculoskeletal: global weakness but symmetric in upper and lower extremities  Psychiatric: Follows basic commands and attempts to answer questions though unable to do so appropriately Neurologic:  CN 2-12 grossly intact, moves all extremities in coordinated fashion.          Labs on Admission:  Basic Metabolic Panel:  Recent Labs Lab 04/09/15 1503 04/09/15 1519  NA 146*  --   K 2.9*  --   CL 97*  --   CO2 33*  --   GLUCOSE 107*  --   BUN 57*  --   CREATININE 1.84*  --   CALCIUM 9.8  --   MG  --  2.3   Liver Function Tests:  Recent Labs Lab 04/09/15 1503  AST 36  ALT 27  ALKPHOS 78  BILITOT 2.3*  PROT 6.2*  ALBUMIN 3.2*   No results for input(s): LIPASE, AMYLASE in the last 168 hours. No results for input(s): AMMONIA in the last 168 hours. CBC:  Recent Labs Lab 04/09/15 1503  WBC 16.5*  NEUTROABS 12.8*  HGB 17.0*  HCT 51.2*  MCV 92.1  PLT 254   Cardiac Enzymes:  Recent Labs Lab 04/09/15 1519  TROPONINI 0.11*    BNP (last 3 results) No results for input(s): BNP in the last 8760 hours.  ProBNP (last 3 results) No results for input(s): PROBNP in the last 8760 hours.   Creatinine clearance cannot be calculated (Unknown ideal weight.)  CBG: No results for input(s): GLUCAP in the last 168 hours.  Radiological Exams on Admission: Dg Chest Portable 1 View  04/09/2015  CLINICAL DATA:  generalized weakness, fatigue, and malaise since yesterday. Facility reports unable to ambulate ---- lack of appetite for the past two days. Pt lethargic upon assessment. EXAM: PORTABLE CHEST - 1 VIEW COMPARISON:  02/01/2015 FINDINGS: Lungs are clear. Heart size and mediastinal contours are within normal limits. No effusion. Visualized skeletal structures are  unremarkable. IMPRESSION: No acute cardiopulmonary disease. Electronically Signed   By: Catherine Steele M.D.   On: 04/09/2015 16:04      Assessment/Plan Principal Problem:   Acute encephalopathy Active Problems:   DNR (do not resuscitate)   AKI (acute kidney injury) (HCC)   Elevated troponin   Hypokalemia   Physical deconditioning   Hypernatremia   Essential hypertension  Acute encephalopathy: patient with baseline dementia worsened from likely infection. Suspect urologic etiology. AFVSS. Lactic acid 2.4, WBC 16.5, CR 1.84. Unable to obtain a catheter specimen at this time due to severe dehydration. 500 mL normal saline bolus in ED. DNR -  Ceftriaxone - BCX, UCX - UA pending - 1 L NS bolus, - IVF 1/2 NS 143ml/hr.  - sips w/ meds, ADAT - cycle lactic acid  AKI: Cr 1.89. Suspect pre-renal/dehydration vs possible infectious. Baseline 0.9.  - IVF - BMET in am  Hypo: 2.9. given in ED - Mag - KCL x5  Elevated Trop: 0.11. EKG w/ LBBB (old) w/o sign of ACS - cycle trop - EKG in am  Physical deconditioning: Suspect multifactorial etiology including worsening dementia and infection. - Treatment as above - PT/OT/nutrition consult  HTN: - cont clonidine, norvasc, atenolol  Hypernatremia: 146 on admission. Suspect dehydration. 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

## 2015-04-09 NOTE — ED Notes (Signed)
Second attempt to in and out patient. Still unable to obtain any urine.

## 2015-04-09 NOTE — ED Provider Notes (Signed)
CSN: [id number]     Arrival date & time 04/09/15  1432 History   First MD Initiated Contact with Patient 04/09/15 1513     Chief Complaint  Patient presents with  . Fatigue      The history is limited by the condition of the patient (hx confusion).  Pt was seen at 1520. Per EMS and NH report: Pt with generalized weakness/fatigue for the past 2 days. Has been taking decreased PO x2 days. Pt was unable to ambulate today (per her baseline), so she was sent to the ED for further evaluation. No reported fevers, no vomiting/diarrhea.      Past Medical History  Diagnosis Date  . Anxiety   . Hyperlipidemia   . Vertigo   . Carotid stenosis   . Vitamin D deficiency   . Hypertension   . Neuromuscular disorder (HCC)     post herpetic neuralgia  . Gallstones   . Arthritis   . Carotid stenosis   . Vitamin D deficiency   . DNR (do not resuscitate)   . Confusion    History reviewed. No pertinent past surgical history.  Social History  Substance Use Topics  . Smoking status: Never Smoker   . Smokeless tobacco: Never Used  . Alcohol Use: No    Review of Systems  Unable to perform ROS: Dementia     Allergies  Codeine and Niacin and related  Home Medications   Prior to Admission medications   Medication Sig Start Date End Date Taking? Authorizing Provider  ALPRAZolam Prudy Feeler) 0.5 MG tablet Take 1 tablet (0.5 mg total) by mouth 2 (two) times daily as needed for anxiety (for restlessness and agitation). 02/20/15  Yes Junie Spencer, FNP  amLODipine (NORVASC) 5 MG tablet TAKE ONE TABLET BY MOUTH ONE  TIME DAILY 08/20/14  Yes Junie Spencer, FNP  ARIPiprazole (ABILIFY) 2 MG tablet Take 2 mg by mouth daily.   Yes Historical Provider, MD  aspirin 325 MG tablet Take 325 mg by mouth daily.   Yes Historical Provider, MD  atenolol (TENORMIN) 50 MG tablet TAKE 2 TABLETS BY MOUTH DAILY AS DIRECTED Patient taking differently: Take 100 mg by mouth daily.  08/14/14  Yes Junie Spencer, FNP   Cholecalciferol (VITAMIN D3) 2000 UNITS TABS Take 1 tablet by mouth daily.   Yes Historical Provider, MD  cloNIDine (CATAPRES) 0.1 MG tablet TAKE ONE TABLET BY MOUTH THREE TIMES DAILY 08/27/14  Yes Junie Spencer, FNP  fluticasone (FLONASE) 50 MCG/ACT nasal spray Place 2 sprays into both nostrils daily. 02/23/14  Yes Junie Spencer, FNP  levothyroxine (SYNTHROID, LEVOTHROID) 75 MCG tablet TAKE 1 TABLET (75 MCG TOTAL)  BY MOUTH DAILY. 11/27/14  Yes Junie Spencer, FNP  hydrocortisone 2.5 % cream Apply topically 2 (two) times daily. Let patient know when rx is ready for pick up Patient not taking: Reported on 04/09/2015 03/19/15   Junie Spencer, FNP  sulfamethoxazole-trimethoprim (BACTRIM DS,SEPTRA DS) 800-160 MG tablet Take 1 tablet by mouth 2 (two) times daily. Patient not taking: Reported on 04/09/2015 02/21/15   Junie Spencer, FNP   BP 138/72 mmHg  Pulse 64  Temp(Src) 97.6 F (36.4 C) (Oral)  Resp 13  SpO2 100% Physical Exam  1525: Physical examination:  Nursing notes reviewed; Vital signs and O2 SAT reviewed;  Constitutional: Well developed, Well nourished, In no acute distress; Head:  Normocephalic, atraumatic; Eyes: EOMI, PERRL, No scleral icterus; ENMT: Mouth and pharynx normal, Mucous membranes dry and crusted.;  Neck: Supple, Full range of motion, No lymphadenopathy; Cardiovascular: Regular rate and rhythm, No gallop; Respiratory: Breath sounds clear & equal bilaterally, No wheezes.  Speaking full sentences, Normal respiratory effort/excursion; Chest: Nontender, Movement normal; Abdomen: Soft, Nontender, Nondistended, Normal bowel sounds; Genitourinary: No CVA tenderness; Extremities: Pulses normal, pelvis stable. +fading ecchymosis and healing abrasions bilat patellar areas. Bilat knees NT, no deformity.  No edema, No calf edema or asymmetry.; Neuro: Awake, alert, confused re: events. No facial droop. Moves all extremities on stretcher spontaneously..; Skin: Color normal, Warm,  Dry.   ED Course  Procedures (including critical care time) Labs Review   Imaging Review  I have personally reviewed and evaluated these images and lab results as part of my medical decision-making.   EKG Interpretation   Date/Time:  Tuesday April 09 2015 14:52:22 EST Ventricular Rate:  62 PR Interval:  154 QRS Duration: 164 QT Interval:  494 QTC Calculation: 501 R Axis:   -4 Text Interpretation:  Normal sinus rhythm Left bundle branch block  Abnormal ECG No significant change since last tracing Confirmed by LIU MD,  DANA [phone]) on 04/09/2015 2:59:23 PM      MDM  MDM Reviewed: previous chart, nursing note and vitals Reviewed previous: labs and ECG Interpretation: labs, ECG and x-ray      Results for orders placed or performed during the hospital encounter of 04/09/15  CBC with Differential/Platelet  Result Value Ref Range   WBC 16.5 (H) 4.0 - 10.5 K/uL   RBC 5.56 (H) 3.87 - 5.11 MIL/uL   Hemoglobin 17.0 (H) 12.0 - 15.0 g/dL   HCT 60.451.2 (H) 54.036.0 - 98.146.0 %   MCV 92.1 78.0 - 100.0 fL   MCH 30.6 26.0 - 34.0 pg   MCHC 33.2 30.0 - 36.0 g/dL   RDW 19.115.2 47.811.5 - 29.515.5 %   Platelets 254 150 - 400 K/uL   Neutrophils Relative % 77 %   Neutro Abs 12.8 (H) 1.7 - 7.7 K/uL   Lymphocytes Relative 14 %   Lymphs Abs 2.3 0.7 - 4.0 K/uL   Monocytes Relative 9 %   Monocytes Absolute 1.5 (H) 0.1 - 1.0 K/uL   Eosinophils Relative 0 %   Eosinophils Absolute 0.0 0.0 - 0.7 K/uL   Basophils Relative 0 %   Basophils Absolute 0.0 0.0 - 0.1 K/uL  Comprehensive metabolic panel  Result Value Ref Range   Sodium 146 (H) 135 - 145 mmol/L   Potassium 2.9 (L) 3.5 - 5.1 mmol/L   Chloride 97 (L) 101 - 111 mmol/L   CO2 33 (H) 22 - 32 mmol/L   Glucose, Bld 107 (H) 65 - 99 mg/dL   BUN 57 (H) 6 - 20 mg/dL   Creatinine, Ser 6.211.84 (H) 0.44 - 1.00 mg/dL   Calcium 9.8 8.9 - 30.810.3 mg/dL   Total Protein 6.2 (L) 6.5 - 8.1 g/dL   Albumin 3.2 (L) 3.5 - 5.0 g/dL   AST 36 15 - 41 U/L   ALT 27 14 - 54  U/L   Alkaline Phosphatase 78 38 - 126 U/L   Total Bilirubin 2.3 (H) 0.3 - 1.2 mg/dL   GFR calc non Af Amer 22 (L) >60 mL/min   GFR calc Af Amer 26 (L) >60 mL/min   Anion gap 16 (H) 5 - 15  Troponin I  Result Value Ref Range   Troponin I 0.11 (H) <0.031 ng/mL  Lactic acid, plasma  Result Value Ref Range   Lactic Acid, Venous 2.4 (HH) 0.5 -  2.0 mmol/L   Dg Chest Portable 1 View 04/09/2015  CLINICAL DATA:  generalized weakness, fatigue, and malaise since yesterday. Facility reports unable to ambulate ---- lack of appetite for the past two days. Pt lethargic upon assessment. EXAM: PORTABLE CHEST - 1 VIEW COMPARISON:  02/01/2015 FINDINGS: Lungs are clear. Heart size and mediastinal contours are within normal limits. No effusion. Visualized skeletal structures are unremarkable. IMPRESSION: No acute cardiopulmonary disease. Electronically Signed   By: Corlis Leak M.D.   On: 04/09/2015 16:04   Results for SHAKARI, QAZI (MRN [id number]) as of 04/09/2015 16:55  Ref. Range 02/03/2015 06:34 02/19/2015 14:47 04/09/2015 15:03  BUN Latest Ref Range: 6-20 mg/dL 12 15 57 (H)  Creatinine Latest Ref Range: 0.44-1.00 mg/dL 1.32 4.40 1.02 (H)    7253:  No urine in bladder x2 attempts. IVF continues. Pt clinically dehydrated, with elevated BUN/Cr per baseline. Troponin elevated, but EKG unchanged from previous. Potassium repleted IV; will also obtain magnesium level. T/C to [facility]. Konrad Dolores, case discussed, including:  HPI, pertinent PM/SHx, VS/PE, dx testing, ED course and treatment:  Agreeable to admit, requests to write temporary orders, obtain tele bed to team APAdmits.   Samuel Jester, DO 04/11/15 1334

## 2015-04-09 NOTE — ED Notes (Signed)
Phlebotomy at bedside.

## 2015-04-09 NOTE — ED Notes (Signed)
Attempted to in and out cath patient, but urine return was very thick and scarce. Will attempt again after fluid bolus completion.

## 2015-04-09 NOTE — ED Notes (Signed)
[facility] [address] [place] called out for generalized weakness, fatigue, and malaise since yesterday. Facility reports unable to ambulate even with two staff assist. States pt has had a lack of appetite for the past two days. Pt lethargic upon assessment. Pt AO.

## 2015-04-09 NOTE — Progress Notes (Signed)
ANTIBIOTIC CONSULT NOTE - INITIAL  Pharmacy Consult for ceftriaxone Indication: UTI  Allergies  Allergen Reactions  . Codeine     Unknown reaction  . Niacin And Related     Unknown reaction    Patient Measurements:    Vital Signs: Temp: 97.6 F (36.4 C) (11/22 1452) Temp Source: Oral (11/22 1452) BP: 123/60 mmHg (11/22 1730) Pulse Rate: 65 (11/22 1730) Intake/Output from previous day:   Intake/Output from this shift:    Labs:  Recent Labs  04/09/15 1503  WBC 16.5*  HGB 17.0*  PLT 254  CREATININE 1.84*   CrCl cannot be calculated (Unknown ideal weight.). No results for input(s): VANCOTROUGH, VANCOPEAK, VANCORANDOM, GENTTROUGH, GENTPEAK, GENTRANDOM, TOBRATROUGH, TOBRAPEAK, TOBRARND, AMIKACINPEAK, AMIKACINTROU, AMIKACIN in the last 72 hours.   Microbiology: No results found for this or any previous visit (from the past 720 hour(s)).  Medical History: Past Medical History  Diagnosis Date  . Anxiety   . Hyperlipidemia   . Vertigo   . Carotid stenosis   . Vitamin D deficiency   . Hypertension   . Neuromuscular disorder (HCC)     post herpetic neuralgia  . Gallstones   . Arthritis   . Carotid stenosis   . Vitamin D deficiency   . DNR (do not resuscitate)   . Confusion     Medications:  See medication history Assessment: 81 yo lady to start rocephin for UTI  Goal of Therapy:  Eradication of infection  Plan:  Continue rocephin 1 gm IV q24 hours F/u cultures and clinical course Thanks for allowing pharmacy to be a part of this patient's care.  Talbert CageLora Rafik Koppel, PharmD Clinical Pharmacist  04/09/2015,6:10 PM

## 2015-04-09 NOTE — Progress Notes (Signed)
Pt is admitted to A311. Pt is AOX3. Admission vital is stable and patient is lethargic.

## 2015-04-10 ENCOUNTER — Encounter (HOSPITAL_COMMUNITY): Payer: Self-pay | Admitting: *Deleted

## 2015-04-10 DIAGNOSIS — E870 Hyperosmolality and hypernatremia: Secondary | ICD-10-CM

## 2015-04-10 DIAGNOSIS — Z7982 Long term (current) use of aspirin: Secondary | ICD-10-CM | POA: Diagnosis not present

## 2015-04-10 DIAGNOSIS — E785 Hyperlipidemia, unspecified: Secondary | ICD-10-CM | POA: Diagnosis present

## 2015-04-10 DIAGNOSIS — E039 Hypothyroidism, unspecified: Secondary | ICD-10-CM | POA: Diagnosis present

## 2015-04-10 DIAGNOSIS — R6510 Systemic inflammatory response syndrome (SIRS) of non-infectious origin without acute organ dysfunction: Secondary | ICD-10-CM

## 2015-04-10 DIAGNOSIS — Z515 Encounter for palliative care: Secondary | ICD-10-CM | POA: Diagnosis not present

## 2015-04-10 DIAGNOSIS — R7989 Other specified abnormal findings of blood chemistry: Secondary | ICD-10-CM | POA: Diagnosis not present

## 2015-04-10 DIAGNOSIS — L8990 Pressure ulcer of unspecified site, unspecified stage: Secondary | ICD-10-CM | POA: Insufficient documentation

## 2015-04-10 DIAGNOSIS — F419 Anxiety disorder, unspecified: Secondary | ICD-10-CM | POA: Diagnosis present

## 2015-04-10 DIAGNOSIS — R5381 Other malaise: Secondary | ICD-10-CM | POA: Diagnosis not present

## 2015-04-10 DIAGNOSIS — Z66 Do not resuscitate: Secondary | ICD-10-CM | POA: Diagnosis not present

## 2015-04-10 DIAGNOSIS — G9340 Encephalopathy, unspecified: Secondary | ICD-10-CM | POA: Diagnosis not present

## 2015-04-10 DIAGNOSIS — M1990 Unspecified osteoarthritis, unspecified site: Secondary | ICD-10-CM | POA: Diagnosis present

## 2015-04-10 DIAGNOSIS — I10 Essential (primary) hypertension: Secondary | ICD-10-CM | POA: Diagnosis not present

## 2015-04-10 DIAGNOSIS — R627 Adult failure to thrive: Secondary | ICD-10-CM | POA: Diagnosis not present

## 2015-04-10 DIAGNOSIS — R531 Weakness: Secondary | ICD-10-CM | POA: Diagnosis present

## 2015-04-10 DIAGNOSIS — N179 Acute kidney failure, unspecified: Secondary | ICD-10-CM | POA: Diagnosis not present

## 2015-04-10 DIAGNOSIS — F0390 Unspecified dementia without behavioral disturbance: Secondary | ICD-10-CM | POA: Diagnosis present

## 2015-04-10 DIAGNOSIS — R32 Unspecified urinary incontinence: Secondary | ICD-10-CM | POA: Diagnosis present

## 2015-04-10 DIAGNOSIS — E860 Dehydration: Secondary | ICD-10-CM | POA: Diagnosis not present

## 2015-04-10 DIAGNOSIS — E876 Hypokalemia: Secondary | ICD-10-CM | POA: Diagnosis not present

## 2015-04-10 LAB — BASIC METABOLIC PANEL
ANION GAP: 12 (ref 5–15)
BUN: 55 mg/dL — ABNORMAL HIGH (ref 6–20)
CALCIUM: 9 mg/dL (ref 8.9–10.3)
CHLORIDE: 105 mmol/L (ref 101–111)
CO2: 30 mmol/L (ref 22–32)
Creatinine, Ser: 1.57 mg/dL — ABNORMAL HIGH (ref 0.44–1.00)
GFR calc non Af Amer: 27 mL/min — ABNORMAL LOW (ref >60)
GFR, EST AFRICAN AMERICAN: 31 mL/min — AB (ref >60)
Glucose, Bld: 83 mg/dL (ref 65–99)
POTASSIUM: 3.3 mmol/L — AB (ref 3.5–5.1)
Sodium: 147 mmol/L — ABNORMAL HIGH (ref 135–145)

## 2015-04-10 LAB — CBC
HEMATOCRIT: 44.3 % (ref 36.0–46.0)
HEMOGLOBIN: 14.5 g/dL (ref 12.0–15.0)
MCH: 30.3 pg (ref 26.0–34.0)
MCHC: 32.7 g/dL (ref 30.0–36.0)
MCV: 92.7 fL (ref 78.0–100.0)
Platelets: 147 10*3/uL — ABNORMAL LOW (ref 150–400)
RBC: 4.78 MIL/uL (ref 3.87–5.11)
RDW: 15.5 % (ref 11.5–15.5)
WBC: 15.8 10*3/uL — AB (ref 4.0–10.5)

## 2015-04-10 LAB — TROPONIN I: TROPONIN I: 0.08 ng/mL — AB (ref <0.031)

## 2015-04-10 LAB — LACTIC ACID, PLASMA: Lactic Acid, Venous: 1.6 mmol/L (ref 0.5–2.0)

## 2015-04-10 MED ORDER — SODIUM CHLORIDE 0.9 % IV SOLN
INTRAVENOUS | Status: DC
  Administered 2015-04-10: 15:00:00 via INTRAVENOUS
  Filled 2015-04-10 (×4): qty 1000

## 2015-04-10 NOTE — Progress Notes (Signed)
Unable to obtain urine specimen by in and out cath after several attempts.  Patient incontinent.  Dr. Gonzella Lexhungel notified.  Will continue to try to obtain specimen.

## 2015-04-10 NOTE — Clinical Social Work Note (Signed)
Clinical Social Work Assessment  Patient Details  Name: Catherine Steele MRN: [id number] Date of Birth: 05-04-1919  Date of referral:  04/10/15               Reason for consult:  Discharge Planning                Permission sought to share information with:    Permission granted to share information::     Name::        Agency::     Relationship::     Contact Information:     Housing/Transportation Living arrangements for the past 2 months:  Assisted Living Facility Source of Information:  Other (Comment Required) (great nephew/HCPOA Medical sales representativeBritt) Patient Interpreter Needed:  None Criminal Activity/Legal Involvement Pertinent to Current Situation/Hospitalization:  No - Comment as needed Significant Relationships:  Other Family Members Lives with:  Facility Resident Do you feel safe going back to the place where you live?  Yes Need for family participation in patient care:  Yes (Comment)  Care giving concerns:  Pt is resident at ALF.    Social Worker assessment / plan:  CSW attempted to meet with pt at bedside. Pt resting and is oriented to self only per chart. CSW spoke with pt's great nephew, Moshe CiproBritt who reports he is also HCPOA. Pt has been a resident at [facility] [place] for almost 4 months. Moshe CiproBritt reports that pt was living alone with just a few hours a day of assist prior to this. Pt became uncomfortable with being home alone and requested to go to [facility], which family arranged. Family appears to be involved and supportive and Moshe CiproBritt indicates they visit often. Moshe CiproBritt reports that pt's mental status has declined significantly the past few months. He requests that pt return to [facility] at d/c. Per Misty StanleyLisa at facility, pt is on AL unit and has home health RN through [facility]. Pt has been refusing to eat or drink recently despite staff working with her and has also just wanted to lay in bed. She has been requiring extensive assist with most ADLs, but at baseline can ambulate with a  walker. Okay for return.   Employment status:  Retired Health and safety inspectornsurance information:  Medicare PT Recommendations:  Not assessed at this time Information / Referral to community resources:  Other (Comment Required) (return to [facility])  Patient/Family's Response to care:  Pt's nephew/HCPOA requests return to [facility] when medically stable.   Patient/Family's Understanding of and Emotional Response to Diagnosis, Current Treatment, and Prognosis:  Moshe CiproBritt appears to be very knowledgeable about pt's medical history and is aware of admission diagnosis. He plans to follow up with RN this morning for update on pt.   Emotional Assessment Appearance:  Appears stated age Attitude/Demeanor/Rapport:  Unable to Assess Affect (typically observed):  Unable to Assess Orientation:  Oriented to Self Alcohol / Substance use:  Not Applicable Psych involvement (Current and /or in the community):  No (Comment)  Discharge Needs  Concerns to be addressed:  Discharge Planning Concerns Readmission within the last 30 days:  No Current discharge risk:  Cognitively Impaired Barriers to Discharge:  Continued Medical Work up   Karn CassisStultz, Seleste Tallman [place], LCSW 04/10/2015, 9:10 AM [phone]

## 2015-04-10 NOTE — Care Management Obs Status (Signed)
MEDICARE OBSERVATION STATUS NOTIFICATION   Patient Details  Name: Catherine Steele MRN: [id number] Date of Birth: [birth year]   Medicare Observation Status Notification Given:  Yes    Malcolm MetroChildress, Thadius Smisek Demske, RN 04/10/2015, 10:20 AM

## 2015-04-10 NOTE — Progress Notes (Signed)
Patient admitted to floor with stage 2 to sacrum.  Patient has excoriated perineum and bruised knees bilaterally with scabs on both knees.  All were present on admission.

## 2015-04-10 NOTE — Plan of Care (Signed)
Problem: Education: Goal: Knowledge of [facility] Education information/materials will improve Outcome: Not Applicable Date Met:  66/64/86 Patient has a h/o dementia

## 2015-04-10 NOTE — Progress Notes (Signed)
Initial Nutrition Assessment  DOCUMENTATION CODES:     Pt meets criteria for severe MALNUTRITION in the context of chronic illness as evidenced by weight loss of >20% in 1 year and energy intake </= 75% for >/= 1 month.   INTERVENTION:  Magic cup BID with lunch and dinner meals, each supplement provides 290 kcal and 9 grams of protein    NUTRITION DIAGNOSIS:  Inadequate oral intake related to poor po intake as evidenced by unplanned weight loss 29% in 13 months and 10% in 45 days both are severe.    GOAL: Pt to meet >/= 90% of their estimated nutrition needs-  unless goals of care become comfort directed.    MONITOR:  Diet advancement, Po intake, labs and wt trends     REASON FOR ASSESSMENT:   Consult Assessment of nutrition requirement/status  ASSESSMENT:  Pt is from ALF. Severe dementia. Total assistance with feeding. She was evaluated by ST today and diet advanced (Puree-Nectar-thick liquids). No family are present. Pt has severe weight loss over the past 13 months especially the past month. Her weight has decreased 10% in 45 days and 29% in 13 months. She is unable to participate in nutrition focused exam.     Abnormal Labs: sodium 147, potassium 3.3, BUN 55 cr 1.57 and GFR-27.  Diet Order:  DIET - DYS 1 Room service appropriate?: Yes; Fluid consistency:: Nectar Thick  Skin:   stage II sacrum  Last BM:   prior to admission  Height:   Ht Readings from Last 1 Encounters:  02/19/15 5\' 5"  (1.651 m)    Weight:   Wt Readings from Last 1 Encounters:  04/09/15 124 lb 5.4 oz (56.4 kg)    Ideal Body Weight:   (no height available)  BMI:  Body mass index is 20.69 kg/(m^2).  Estimated Nutritional Needs:   Kcal:  1300-1500  Protein:  60-70 gr  Fluid:  >1500 ml/day  EDUCATION NEEDS:     Catherine ShiversLynn Arlington Sigmund MS,RD,CSG,LDN Office: [phone] Pager: [phone]

## 2015-04-10 NOTE — Progress Notes (Addendum)
[facility] PROGRESS NOTE  Catherine Steele [facility] ZOX:[id number] DOB: 1918-09-27 DOA: 04/09/2015 PCP: [name], FNP  Brief narrative 81 year old female with baseline severe dementia resident of assisted living memory care unit with history of carotid stenosis, hypertension, anxiety was brought to the ED with progressive weakness and decreased by mouth intake for the past 2 days. Patient has severe dementia at baseline but has worsened in the past several days. Patient found to have leukocytosis with hypernatremia , hypokalemia and acute kidney injury. Also had lactic acid of 2.4 meeting criteria for SIRS. She also had mildly elevated troponin possibly in the setting of dehydration. Admitted to hospitalist service .  Assessment/Plan: SIRS with Acute encephalopathy Secondary to severe dehydration . UA still not sent. Placed on empiric Rocephin with suspicion for UTI. Continue IV hydration. Lactic acid normalized. Follow blood cultures. Mental status appears improved although patient is very demented. Will check swallow evaluation before resuming her diet.  Acute kidney injury Appears to be prerenal with severe dehydration. Avoid nephrotoxins. Renal function improving with IV hydration. Monitor urine output. (Patient is incontinent)  Elevated troponin Patient asymptomatic. EKG shows old LBBB. troponin peaked at 0.11. Continue  aspirin. Will not pursue further workup .  Hypokalemia and hypernatremia Secondary to severe dehydration. Improving with IV fluids and potassium supplement. Mg normal.   severe dementia Continue Abilify. Xanax for anxiety.  Hypothyroidism Continue Synthroid   Essential hypertension Stable. Continue atenolol and clonidine  Diet: Nothing by mouth until cleared by swallow evaluation.   DVT prophylaxis: subcutaneous heparin    Code Status: DNR Family Communication:  Updated her nephew Moshe Cipro on the phone Disposition Plan: Return to assist living once  symptoms better. Possibly in the next 48 hours   Consultants:  None  Procedures:  None  Antibiotics:  IV Rocephin  HPI/Subjective: Seen and examined. Admission H&P reviewed. Patient remains very confused but is awake and alert  Objective: Filed Vitals:   04/09/15 2225 04/10/15 0638  BP: 140/57 150/54  Pulse: 65 64  Temp: 98 F (36.7 C) 97.5 F (36.4 C)  Resp: 20 16   No intake or output data in the 24 hours ending 04/10/15 1156 Filed Weights   04/09/15 1831  Weight: 56.4 kg (124 lb 5.4 oz)    Exam:   General:  Elderly female not in distress  HEENT: No pallor, dry oral mucosa, supple neck  Chest: Clear to auscultation bilaterally  CVS: S1 and S2 regular, 3/6 systolic murmur  GI: Soft, nondistended, nontender, bowel sounds present  Musculoskeletal: Warm, no edema  CNS: Alert and awake, not oriented    Data Reviewed: Basic Metabolic Panel:  Recent Labs Lab 04/09/15 1503 04/09/15 1519 04/10/15 0805  NA 146*  --  147*  K 2.9*  --  3.3*  CL 97*  --  105  CO2 33*  --  30  GLUCOSE 107*  --  83  BUN 57*  --  55*  CREATININE 1.84*  --  1.57*  CALCIUM 9.8  --  9.0  MG  --  2.3  --    Liver Function Tests:  Recent Labs Lab 04/09/15 1503  AST 36  ALT 27  ALKPHOS 78  BILITOT 2.3*  PROT 6.2*  ALBUMIN 3.2*   No results for input(s): LIPASE, AMYLASE in the last 168 hours. No results for input(s): AMMONIA in the last 168 hours. CBC:  Recent Labs Lab 04/09/15 1503 04/10/15 0805  WBC 16.5* 15.8*  NEUTROABS 12.8*  --   HGB 17.0*  14.5  HCT 51.2* 44.3  MCV 92.1 92.7  PLT 254 147*   Cardiac Enzymes:  Recent Labs Lab 04/09/15 1519 04/09/15 1950 04/10/15 0205  TROPONINI 0.11* 0.09* 0.08*   BNP (last 3 results) No results for input(s): BNP in the last 8760 hours.  ProBNP (last 3 results) No results for input(s): PROBNP in the last 8760 hours.  CBG: No results for input(s): GLUCAP in the last 168 hours.  Recent Results (from the  past 240 hour(s))  Culture, blood (routine x 2)     Status: None (Preliminary result)   Collection Time: 04/09/15  3:00 PM  Result Value Ref Range Status   Specimen Description BLOOD LEFT ANTECUBITAL  Final   Special Requests BOTTLES DRAWN AEROBIC ONLY 4CC  Final   Culture NO GROWTH < 24 HOURS  Final   Report Status PENDING  Incomplete  Culture, blood (routine x 2)     Status: None (Preliminary result)   Collection Time: 04/09/15  7:50 PM  Result Value Ref Range Status   Specimen Description BLOOD LEFT FOREARM  Final   Special Requests BOTTLES DRAWN AEROBIC ONLY 6CC  Final   Culture NO GROWTH < 24 HOURS  Final   Report Status PENDING  Incomplete  MRSA PCR Screening     Status: None   Collection Time: 04/09/15  7:50 PM  Result Value Ref Range Status   MRSA by PCR NEGATIVE NEGATIVE Final    Comment:        The GeneXpert MRSA Assay (FDA approved for NASAL specimens only), is one component of a comprehensive MRSA colonization surveillance program. It is not intended to diagnose MRSA infection nor to guide or monitor treatment for MRSA infections.      Studies: Dg Chest Portable 1 View  04/09/2015  CLINICAL DATA:  generalized weakness, fatigue, and malaise since yesterday. Facility reports unable to ambulate ---- lack of appetite for the past two days. Pt lethargic upon assessment. EXAM: PORTABLE CHEST - 1 VIEW COMPARISON:  02/01/2015 FINDINGS: Lungs are clear. Heart size and mediastinal contours are within normal limits. No effusion. Visualized skeletal structures are unremarkable. IMPRESSION: No acute cardiopulmonary disease. Electronically Signed   By: Corlis Leak  Hassell M.D.   On: 04/09/2015 16:04    Scheduled Meds: . sodium chloride   Intravenous STAT  . amLODipine  5 mg Oral Daily  . ARIPiprazole  2 mg Oral Daily  . aspirin  325 mg Oral Daily  . atenolol  100 mg Oral Daily  . cefTRIAXone (ROCEPHIN)  IV  1 g Intravenous Q24H  . cloNIDine  0.1 mg Oral TID  . heparin  5,000 Units  Subcutaneous 3 times per day  . levothyroxine  75 mcg Oral QAC breakfast  . sodium chloride  3 mL Intravenous Q12H   Continuous Infusions: . sodium chloride 100 mL/hr at 04/10/15 1004      Time spent: 25 minutes    Tayva Easterday  [facility] Pager [phone]. If 7PM-7AM, please contact night-coverage at [email or web address], password [email or web address] 04/10/2015, 11:56 AM

## 2015-04-10 NOTE — Progress Notes (Signed)
1210 - Unable to obtain specimen by in and out catheter.  Small amount of thick yellow urine noted.  Will attempt this afternoon.

## 2015-04-10 NOTE — Care Management Note (Signed)
Case Management Note  Patient Details  Name: Norva Rifflernestine J Vallone MRN: [id number] Date of Birth: 1919/04/08  Subjective/Objective:                  Pt is from [facility], admitted with AMS. Pt has dementia and is able to ambulate with walker but requires extensive assistance with ADL's. Family and facility report recent decline in functional status and worsening of mental state.    Action/Plan: Anticipate return to SNF at DC. Pt PT eval canceled, with MD approval, due to pt's dementia and not being appropriate for rehab. ? Palliative consult while in hospital. CSW is involved and will arrange for return to facility when appropriate. No CM needs anticipated.   Expected Discharge Date:     04/12/2015             Expected Discharge Plan:  Assisted Living / Rest Home  In-House Referral:  Clinical Social Work  Discharge planning Services  CM Consult  Post Acute Care Choice:  NA Choice offered to:  NA  DME Arranged:    DME Agency:     HH Arranged:    HH Agency:     Status of Service:  Completed, signed off  Medicare Important Message Given:    Date Medicare IM Given:    Medicare IM give by:    Date Additional Medicare IM Given:    Additional Medicare Important Message give by:     If discussed at Long Length of Stay Meetings, dates discussed:    Additional Comments:  Malcolm MetroChildress, Halle Davlin Demske, RN 04/10/2015, 10:25 AM

## 2015-04-10 NOTE — Evaluation (Signed)
Clinical/Bedside Swallow Evaluation Patient Details  Name: Catherine Steele MRN: [id number] Date of Birth: 02/16/1919  Today's Date: 04/10/2015 Time: SLP Start Time (ACUTE ONLY): 1335 SLP Stop Time (ACUTE ONLY): 1410 SLP Time Calculation (min) (ACUTE ONLY): 35 min  Past Medical History:  Past Medical History  Diagnosis Date  . Anxiety   . Hyperlipidemia   . Vertigo   . Carotid stenosis   . Vitamin D deficiency   . Hypertension   . Neuromuscular disorder (HCC)     post herpetic neuralgia  . Gallstones   . Arthritis   . Carotid stenosis   . Vitamin D deficiency   . DNR (do not resuscitate)   . Confusion    Past Surgical History: History reviewed. No pertinent past surgical history. HPI:  Pt is a 81 y/o female with severe dementia at baseline; Per report by ED physician, nursing home, and patient's healthcare POA , patient was in her normal state of health up to a couple of days ago when she began to show evidence of generalized fatigue, weakness and very little oral intake. Patient is without focal complaint but does complain of generalized pain. Patient is confused at baseline but this has worsened over the last several days. Symptoms are constant and getting worse.    Assessment / Plan / Recommendation Clinical Impression  Pt presents at bedside with mild/moderate oral and mild pharyngeal dysphagia following admission to ED with generalized weakness and very little oral intake. During oral mechanism exam SLP noted white patchy secretions on pt's lingual surface and dry red labial and lingual surfaces; reported to nursing. Pt was very lethargic and required max cues throughout trials to remain alert. With all trials of thin pt demonstrated poor bolus awareness, oral holding, piecemeal deglutition, audible swallows, wet vocal quality and immediate coughing (on 3/4 trials). Pt tolerated trials of NTL with no overt s/sx of aspiration seeming to indicate improved protection of airway and  improved control/coordination. Pt tolerated puree trials with mild oral holding, piecemeal deglutition and delayed throat clears and one delayed cough.  Recommend D1 (puree) and Nectar thick Liquids with meds to be administered crushed in puree. ST will follow up to assess for diet upgrade/tolerance.     Aspiration Risk  Moderate aspiration risk    Diet Recommendation     Medication Administration: Crushed with puree    Other  Recommendations Oral Care Recommendations: Oral care BID Other Recommendations: Remove water pitcher;Order thickener from pharmacy   Follow up Recommendations       Frequency and Duration min 2x/week  2 weeks       Prognosis Prognosis for Safe Diet Advancement: Fair Barriers to Reach Goals: Cognitive deficits      Swallow Study   General Date of Onset: 04/09/15 HPI: Pt is a 81 y/o female with severe dementia at baseline; Per report by ED physician, nursing home, and patient's healthcare POA , patient was in her normal state of health up to a couple of days ago when she began to show evidence of generalized fatigue, weakness and very little oral intake. Patient is without focal complaint but does complain of generalized pain. Patient is confused at baseline but this has worsened over the last several days. Symptoms are constant and getting worse.  Previous Swallow Assessment: none Diet Prior to this Study: Information not available Temperature Spikes Noted: No Respiratory Status: Nasal cannula History of Recent Intubation: No Behavior/Cognition: Confused;Lethargic/Drowsy;Requires cueing Oral Cavity Assessment: Dry;Dried secretions Oral Care Completed by SLP: Yes  Oral Cavity - Dentition: Missing dentition Self-Feeding Abilities: Total assist Patient Positioning: Upright in bed Baseline Vocal Quality: Hoarse;Low vocal intensity Volitional Cough: Cognitively unable to elicit Volitional Swallow: Unable to elicit    Oral/Motor/Sensory Function Overall Oral  Motor/Sensory Function: Generalized oral weakness   Ice Chips     Thin Liquid Thin Liquid: Impaired Presentation: Cup;Straw Oral Phase Impairments: Reduced labial seal;Reduced lingual movement/coordination Oral Phase Functional Implications: Oral holding;Right anterior spillage;Left anterior spillage Pharyngeal  Phase Impairments: Suspected delayed Swallow;Multiple swallows;Wet Vocal Quality;Cough - Immediate;Throat Clearing - Delayed    Nectar Thick Nectar Thick Liquid: Within functional limits   Honey Thick Honey Thick Liquid: Not tested   Puree Puree: Impaired Presentation: Spoon Oral Phase Impairments: Poor awareness of bolus;Reduced lingual movement/coordination Oral Phase Functional Implications: Prolonged oral transit;Oral residue;Oral holding Pharyngeal Phase Impairments: Throat Clearing - Delayed;Cough - Delayed;Multiple swallows   Solid Solid: Not tested         Reyn Faivre H. Romie Levee, CCC-SLP Speech Language Pathologist  Georgetta Haber 04/10/2015,2:27 PM

## 2015-04-11 DIAGNOSIS — R627 Adult failure to thrive: Secondary | ICD-10-CM

## 2015-04-11 LAB — CBC
HCT: 41.5 % (ref 36.0–46.0)
Hemoglobin: 13.4 g/dL (ref 12.0–15.0)
MCH: 30 pg (ref 26.0–34.0)
MCHC: 32.3 g/dL (ref 30.0–36.0)
MCV: 92.8 fL (ref 78.0–100.0)
PLATELETS: 132 10*3/uL — AB (ref 150–400)
RBC: 4.47 MIL/uL (ref 3.87–5.11)
RDW: 15.5 % (ref 11.5–15.5)
WBC: 12 10*3/uL — AB (ref 4.0–10.5)

## 2015-04-11 LAB — BASIC METABOLIC PANEL
Anion gap: 9 (ref 5–15)
BUN: 34 mg/dL — AB (ref 6–20)
CO2: 32 mmol/L (ref 22–32)
Calcium: 8.3 mg/dL — ABNORMAL LOW (ref 8.9–10.3)
Chloride: 107 mmol/L (ref 101–111)
Creatinine, Ser: 0.9 mg/dL (ref 0.44–1.00)
GFR calc Af Amer: >60 mL/min (ref >60)
GFR, EST NON AFRICAN AMERICAN: 53 mL/min — AB (ref >60)
GLUCOSE: 76 mg/dL (ref 65–99)
POTASSIUM: 3.2 mmol/L — AB (ref 3.5–5.1)
Sodium: 148 mmol/L — ABNORMAL HIGH (ref 135–145)

## 2015-04-11 LAB — URINE MICROSCOPIC-ADD ON
Bacteria, UA: NONE SEEN
SQUAMOUS EPITHELIAL / LPF: NONE SEEN
WBC UA: NONE SEEN WBC/hpf (ref 0–5)

## 2015-04-11 LAB — URINALYSIS, ROUTINE W REFLEX MICROSCOPIC
Bilirubin Urine: NEGATIVE
GLUCOSE, UA: NEGATIVE mg/dL
Leukocytes, UA: NEGATIVE
Nitrite: NEGATIVE
PH: 5.5 (ref 5.0–8.0)
PROTEIN: NEGATIVE mg/dL
Specific Gravity, Urine: 1.01 (ref 1.005–1.030)

## 2015-04-11 MED ORDER — FLEET ENEMA 7-19 GM/118ML RE ENEM
1.0000 | ENEMA | Freq: Once | RECTAL | Status: AC
  Administered 2015-04-11: 1 via RECTAL

## 2015-04-11 MED ORDER — SODIUM CHLORIDE 0.9 % IV SOLN
INTRAVENOUS | Status: DC
  Administered 2015-04-11 – 2015-04-12 (×2): via INTRAVENOUS
  Administered 2015-04-12: 1 mL via INTRAVENOUS

## 2015-04-11 MED ORDER — BISACODYL 10 MG RE SUPP
10.0000 mg | Freq: Once | RECTAL | Status: AC
  Administered 2015-04-11: 10 mg via RECTAL
  Filled 2015-04-11: qty 1

## 2015-04-11 MED ORDER — POTASSIUM CHLORIDE CRYS ER 20 MEQ PO TBCR
40.0000 meq | EXTENDED_RELEASE_TABLET | Freq: Once | ORAL | Status: AC
  Administered 2015-04-11: 40 meq via ORAL
  Filled 2015-04-11: qty 2

## 2015-04-11 MED ORDER — POTASSIUM CHLORIDE CRYS ER 20 MEQ PO TBCR
40.0000 meq | EXTENDED_RELEASE_TABLET | Freq: Once | ORAL | Status: DC
  Filled 2015-04-11 (×2): qty 2

## 2015-04-11 NOTE — Progress Notes (Signed)
Patient has had no results from the Dulcolax suppository.  Dr. Vanessa BarbaraZamora notified via text page.

## 2015-04-11 NOTE — Progress Notes (Addendum)
[facility] PROGRESS NOTE  Catherine Steele AVW:[id number] DOB: 28-Aug-1918 DOA: 04/09/2015 PCP: Jannifer Rodneyhristy Hawks, FNP  Brief narrative 81 year old female with baseline severe dementia resident of assisted living memory care unit with history of carotid stenosis, hypertension, anxiety was brought to the ED with progressive weakness and decreased by mouth intake for the past 2 days. Patient has severe dementia at baseline but has worsened in the past several days. Patient found to have leukocytosis with hypernatremia , hypokalemia and acute kidney injury. Also had lactic acid of 2.4 meeting criteria for SIRS. She also had mildly elevated troponin possibly in the setting of dehydration. Admitted to hospitalist service .  Assessment/Plan: SIRS with Acute encephalopathy -Evidenced by a lactic acid of 2.4, development of acute kidney injury with creatinine 1.84 and BUN 57, White count of 16,500  -Suspect precipitated by urinary tract infection, functional decline, dehydration   Functional decline/failure to thrive -Catherine Steele  is a 81 year old female with a history of advanced dementia requiring assistance with activities of daily living, admitted after having steep decline at her facility. She likely has delirium precipitated by dehydration, poor oral intake, infectious process in the context of advanced dementia.  -Providing IV fluid resuscitation with normal saline along with empiric IV antibiotic therapy with ceftriaxone 1 g IV every 24 hours.   Suspected urinary tract infection Urine was collected today, I suspect she may have a urinary tract infection and is receiving empiric IV antibiotic therapy with ceftriaxone  Acute kidney injury Appears to be prerenal with severe dehydration precipitated by poor oral intake with history of advanced dementia and minimal by mouth intake.  Labs showing a downward trend in creatinine from 1.57-0.9 on a.m. lab work.  She continues to have poor oral  intake, will continue IV fluids for now  Elevated troponin Patient asymptomatic. EKG shows old LBBB. troponin peaked at 0.11. Continue  aspirin. Will not pursue further workup .  Hypokalemia and hypernatremia Likely secondary to poor oral intake, potassium low this morning at 3.2 40 mEq of oral potassium chloride ordered this morning, repeat labs in a.m.  Severe dementia With advanced dementia benzodiazepine therapy likely to worsen behavioral symptoms. Xanax discontinued. Would attempt nonpharmacologic approach in addressing Delirium. Will consider antipsychotic therapy for severe agitation.    Hypothyroidism Continue Synthroid  Essential hypertension Stable. Continue atenolol and clonidine  Diet: dysphagia 1 diet   Goals of Care  -Had an extensive discussion with her grandson over telephone conversation regarding Catherine Steele's medical goals of care. I explained poor prognosis and probable limited life expectancy in the context of baseline poor functional status, advanced dementia, having significant weight loss over the past several months. He does not feel that she would have wanted invasive/burdensome interventions at this point. He is interested in speaking with palliative care. Consult placed.  DVT prophylaxis: subcutaneous heparin    Code Status: DNR Family Communication: I spoke with her grandson over  Disposition Plan: Return to assist living once symptoms better. Possibly in the next 48 hours   Consultants:  None  Procedures:  None  Antibiotics:  IV Rocephin  HPI/Subjective: Catherine Steele is confused, disoriented, cannot provide history. She required assistance with feeding this morning.   Objective: Filed Vitals:   04/10/15 2217 04/11/15 0437  BP: 148/86 152/64  Pulse: 61 58  Temp: 97.7 F (36.5 C) 98.2 F (36.8 C)  Resp: 15 18   No intake or output data in the 24 hours ending 04/11/15 1049 Filed Weights   04/09/15 1831  Weight: 56.4 kg (124 lb 5.4  oz)    Exam:   General:  Elderly female arousable, having difficulty following simple commands   HEENT: No pallor, dry oral mucosa, supple neck  Chest: Clear to auscultation bilaterally  CVS: S1 and S2 regular, 3/6 systolic murmur  GI: Soft, nondistended, nontender, bowel sounds present  Musculoskeletal: Warm, no edema  CNS: patient with advanced dementia appears to be moving all extremities     Data Reviewed: Basic Metabolic Panel:  Recent Labs Lab 04/09/15 1503 04/09/15 1519 04/10/15 0805 04/11/15 0607  NA 146*  --  147* 148*  K 2.9*  --  3.3* 3.2*  CL 97*  --  105 107  CO2 33*  --  30 32  GLUCOSE 107*  --  83 76  BUN 57*  --  55* 34*  CREATININE 1.84*  --  1.57* 0.90  CALCIUM 9.8  --  9.0 8.3*  MG  --  2.3  --   --    Liver Function Tests:  Recent Labs Lab 04/09/15 1503  AST 36  ALT 27  ALKPHOS 78  BILITOT 2.3*  PROT 6.2*  ALBUMIN 3.2*   No results for input(s): LIPASE, AMYLASE in the last 168 hours. No results for input(s): AMMONIA in the last 168 hours. CBC:  Recent Labs Lab 04/09/15 1503 04/10/15 0805 04/11/15 0607  WBC 16.5* 15.8* 12.0*  NEUTROABS 12.8*  --   --   HGB 17.0* 14.5 13.4  HCT 51.2* 44.3 41.5  MCV 92.1 92.7 92.8  PLT 254 147* 132*   Cardiac Enzymes:  Recent Labs Lab 04/09/15 1519 04/09/15 1950 04/10/15 0205  TROPONINI 0.11* 0.09* 0.08*   BNP (last 3 results) No results for input(s): BNP in the last 8760 hours.  ProBNP (last 3 results) No results for input(s): PROBNP in the last 8760 hours.  CBG: No results for input(s): GLUCAP in the last 168 hours.  Recent Results (from the past 240 hour(s))  Culture, blood (routine x 2)     Status: None (Preliminary result)   Collection Time: 04/09/15  3:00 PM  Result Value Ref Range Status   Specimen Description BLOOD LEFT ANTECUBITAL  Final   Special Requests BOTTLES DRAWN AEROBIC ONLY 4CC  Final   Culture NO GROWTH < 24 HOURS  Final   Report Status PENDING   Incomplete  Culture, blood (routine x 2)     Status: None (Preliminary result)   Collection Time: 04/09/15  7:50 PM  Result Value Ref Range Status   Specimen Description BLOOD LEFT FOREARM  Final   Special Requests BOTTLES DRAWN AEROBIC ONLY 6CC  Final   Culture NO GROWTH < 24 HOURS  Final   Report Status PENDING  Incomplete  MRSA PCR Screening     Status: None   Collection Time: 04/09/15  7:50 PM  Result Value Ref Range Status   MRSA by PCR NEGATIVE NEGATIVE Final    Comment:        The GeneXpert MRSA Assay (FDA approved for NASAL specimens only), is one component of a comprehensive MRSA colonization surveillance program. It is not intended to diagnose MRSA infection nor to guide or monitor treatment for MRSA infections.      Studies: Dg Chest Portable 1 View  04/09/2015  CLINICAL DATA:  generalized weakness, fatigue, and malaise since yesterday. Facility reports unable to ambulate ---- lack of appetite for the past two days. Pt lethargic upon assessment. EXAM: PORTABLE CHEST - 1 VIEW COMPARISON:  02/01/2015  FINDINGS: Lungs are clear. Heart size and mediastinal contours are within normal limits. No effusion. Visualized skeletal structures are unremarkable. IMPRESSION: No acute cardiopulmonary disease. Electronically Signed   By: Corlis Leak M.D.   On: 04/09/2015 16:04    Scheduled Meds: . amLODipine  5 mg Oral Daily  . ARIPiprazole  2 mg Oral Daily  . aspirin  325 mg Oral Daily  . atenolol  100 mg Oral Daily  . cefTRIAXone (ROCEPHIN)  IV  1 g Intravenous Q24H  . cloNIDine  0.1 mg Oral TID  . heparin  5,000 Units Subcutaneous 3 times per day  . levothyroxine  75 mcg Oral QAC breakfast  . potassium chloride  40 mEq Oral Once  . sodium chloride  3 mL Intravenous Q12H   Continuous Infusions: . sodium chloride 75 mL/hr at 04/11/15 0918      Time spent: 25 minutes    Jeralyn Bennett  [facility] Pager [phone]. If 7PM-7AM, please contact night-coverage at  [email or web address], password [facility] 04/11/2015, 10:49 AM  LOS: 1 day

## 2015-04-11 NOTE — Progress Notes (Signed)
Urine specimen obtained by in and out catheter.  Peri-care performed prior.  Davy PiqueMary Ann Dickerson, RN & Fara ChuteMary Beth Ivanell Deshotel, RN present.

## 2015-04-11 NOTE — Consult Note (Signed)
This patient appeared on my consult list but from looking at the chart I believe that is a mistake. I did not see her.

## 2015-04-11 NOTE — Progress Notes (Signed)
Patient states she has to go to the bathroom, "I can't get it out."  Patient has hard stool in rectum.  Dr. Vanessa BarbaraZamora notified via text page.

## 2015-04-12 ENCOUNTER — Telehealth: Payer: Self-pay | Admitting: Family Medicine

## 2015-04-12 DIAGNOSIS — N3000 Acute cystitis without hematuria: Secondary | ICD-10-CM

## 2015-04-12 LAB — BASIC METABOLIC PANEL
Anion gap: 11 (ref 5–15)
BUN: 21 mg/dL — AB (ref 6–20)
CALCIUM: 8.2 mg/dL — AB (ref 8.9–10.3)
CO2: 34 mmol/L — AB (ref 22–32)
CREATININE: 0.73 mg/dL (ref 0.44–1.00)
Chloride: 105 mmol/L (ref 101–111)
GFR calc Af Amer: >60 mL/min (ref >60)
GFR calc non Af Amer: >60 mL/min (ref >60)
GLUCOSE: 85 mg/dL (ref 65–99)
Potassium: 3.4 mmol/L — ABNORMAL LOW (ref 3.5–5.1)
Sodium: 150 mmol/L — ABNORMAL HIGH (ref 135–145)

## 2015-04-12 LAB — CBC
HCT: 44.3 % (ref 36.0–46.0)
HEMOGLOBIN: 14.2 g/dL (ref 12.0–15.0)
MCH: 29.9 pg (ref 26.0–34.0)
MCHC: 32.1 g/dL (ref 30.0–36.0)
MCV: 93.3 fL (ref 78.0–100.0)
PLATELETS: 137 10*3/uL — AB (ref 150–400)
RBC: 4.75 MIL/uL (ref 3.87–5.11)
RDW: 15.4 % (ref 11.5–15.5)
WBC: 12.6 10*3/uL — ABNORMAL HIGH (ref 4.0–10.5)

## 2015-04-12 LAB — URINE CULTURE: CULTURE: NO GROWTH

## 2015-04-12 MED ORDER — CEFUROXIME AXETIL 250 MG PO TABS
250.0000 mg | ORAL_TABLET | Freq: Two times a day (BID) | ORAL | Status: DC
  Administered 2015-04-12 – 2015-04-13 (×2): 250 mg via ORAL
  Filled 2015-04-12 (×2): qty 1

## 2015-04-12 MED ORDER — POTASSIUM CHLORIDE CRYS ER 20 MEQ PO TBCR
40.0000 meq | EXTENDED_RELEASE_TABLET | Freq: Once | ORAL | Status: AC
  Administered 2015-04-12: 40 meq via ORAL
  Filled 2015-04-12: qty 2

## 2015-04-12 NOTE — Progress Notes (Signed)
OT Cancellation Note  Patient Details Name: Catherine Steele MRN: [id number] DOB: [birth year]   Cancelled Treatment:     Reason evaluation not completed: Chart reviewed. [facility] and is receiving max-total assist with ADL tasks at baseline. Pt has dementia at baseline [facility] [place] [facility]. Pt is not appropriate for rehab services due to cognitive status.   Ezra SitesLeslie Troxler, OTR/L  [phone]  04/12/2015, 8:02 AM

## 2015-04-12 NOTE — Care Management Note (Signed)
Case Management Note  Patient Details  Name: Catherine Steele MRN: [id number] Date of Birth: 1918/08/22  Subjective/Objective:                    Action/Plan:   Expected Discharge Date:                  Expected Discharge Plan:  Hospice Medical Facility  In-House Referral:  Clinical Social Work  Discharge planning Services  CM Consult  Post Acute Care Choice:  NA Choice offered to:  NA  DME Arranged:    DME Agency:     HH Arranged:    HH Agency:     Status of Service:  Completed, signed off  Medicare Important Message Given:  Yes Date Medicare IM Given:    Medicare IM give by:    Date Additional Medicare IM Given:    Additional Medicare Important Message give by:     If discussed at Long Length of Stay Meetings, dates discussed:    Additional Comments: Family interested in inpt hospice services. CSW is aware and will contact pts [facility] and [facility]. [facility] may qualify for [facility] or return to facility as hospice. Arlyss QueenBlackwell, Asar Evilsizer [place], RN 04/12/2015, 11:53 AM

## 2015-04-12 NOTE — Telephone Encounter (Signed)
[facility] woulld sign papers

## 2015-04-12 NOTE — Progress Notes (Signed)
Follow-up Note  I spoke with Edwinna AreolaBritt Wilkins who is Mrs [facility]. We discussed medical goals of care and he is interested in focusing her care on comfort, minimizing potentially burdensome/invasive procedures. He would like to learn more about Inpatient Hospice. He will be at work today but will be available on his cell :  [phone]. CM consulted for Hospice.

## 2015-04-12 NOTE — Progress Notes (Addendum)
[facility] PROGRESS NOTE  Soniya Ashraf [facility] JXB:[id number] DOB: Apr 11, 1919 DOA: 04/09/2015 PCP: [name], FNP  Brief narrative 81 year old female with baseline severe dementia resident of assisted living memory care unit with history of carotid stenosis, hypertension, anxiety was brought to the ED with progressive weakness and decreased by mouth intake for the past 2 days. Patient has severe dementia at baseline but has worsened in the past several days. Patient found to have leukocytosis with hypernatremia , hypokalemia and acute kidney injury. Also had lactic acid of 2.4 meeting criteria for SIRS. She also had mildly elevated troponin possibly in the setting of dehydration. Admitted to hospitalist service .  Assessment/Plan: SIRS with Acute encephalopathy -Evidenced by a lactic acid of 2.4, development of acute kidney injury with creatinine 1.84 and BUN 57, White count of 16,500  -Suspect precipitated by urinary tract infection, functional decline, dehydration   Functional decline/failure to thrive with history of Endstage Dementia -Mrs Zavada  is a 81 year old female with a history of advanced dementia requiring assistance with activities of daily living, admitted after having steep decline at her facility. She likely has delirium precipitated by dehydration, poor oral intake, infectious process in the context of advanced dementia.  -Providing IV fluid resuscitation with normal saline along with empiric IV antibiotic therapy with ceftriaxone 1 g IV every 24 hours.  -Urine analyses obtained after receiving several doses of IV antibiotic therapy which could explain why u/a is unremarkable.    Suspected urinary tract infection As mentioned above her U/A was obtained after receiving several doses of emperic IV antibiotic therapy with Ceftriaxone. Will continue emperic treatment with antibiotic therapy. On 04/12/2015 her IV ceftriaxone was changed to ceftin 250 mg po q daily today.     Acute kidney injury Appears to be prerenal with severe dehydration precipitated by poor oral intake with history of advanced dementia and minimal by mouth intake.  On 04/12/2015 am labs revealed creatinine of 0.73, decreased from 1.57 on 04/10/2015, reflection of response to IV fluids   Elevated troponin Patient asymptomatic. EKG shows old LBBB. troponin peaked at 0.11. Continue  aspirin. Will not pursue further workup .  Hypokalemia and hypernatremia Likely secondary to poor oral intake, potassium low this morning at 3.2 On 04/12/2015 labs showing Potassium of 3.4, she was given additional oral potassium replacement.   Endstage dementia With advanced dementia benzodiazepine therapy likely to worsen behavioral symptoms. Xanax discontinued. Would attempt nonpharmacologic approach in addressing Delirium. Will consider antipsychotic therapy for severe agitation.    Hypothyroidism Continue Synthroid  Essential hypertension Stable. Continue atenolol and clonidine  Diet: dysphagia 1 diet   Goals of Care  -On 04/11/2015 held extensive discussion with her grandson over telephone conversation regarding Mrs. Jay's medical goals of care. I explained poor prognosis and probable limited life expectancy in the context of a baseline low functional status, advanced dementia, multimorbidity having significant weight loss over the past several months. He does not feel that she would have wanted invasive/burdensome interventions at this point. He is interested in speaking with palliative care. Consult placed. -I was informed that Palliative Care was not available over the following days.  -Spoke with CM, hospice services consulted.   DVT prophylaxis: subcutaneous heparin    Code Status: DNR Family Communication: I called family on 04/12/2015, left voice message.  Disposition Plan: Goals of care being addressed with family, as they are interested in focusing on her comfort. Discussed possibility  of receiving care at inpatient hospice. Hospice consulted.    Consultants:  Palliative Care  Hospice  Procedures:  None  Antibiotics:  IV Rocephin stopped on 04/12/2015  Ceftin 250 mg PO BID started on 04/12/2015  HPI/Subjective: Mrs. Yeoman is confused, disoriented, cannot provide history. She required assistance with feeding this morning.   Objective: Filed Vitals:   04/11/15 2143 04/12/15 0443  BP: 178/48 169/89  Pulse: 63 107  Temp: 97.8 F (36.6 C) 98.6 F (37 C)  Resp: 18 14   No intake or output data in the 24 hours ending 04/12/15 0953 Filed Weights   04/09/15 1831  Weight: 56.4 kg (124 lb 5.4 oz)    Exam:   General:  Elderly female arousable, ill appearing. She can not answer questions or participate in her plan of care.   HEENT: No pallor, dry oral mucosa, supple neck  Chest: Clear to auscultation bilaterally  CVS: S1 and S2 regular, 3/6 systolic murmur  GI: Soft, nondistended, nontender, bowel sounds present  Musculoskeletal: Warm, no edema  CNS: patient with advanced dementia appears to be moving all extremities     Data Reviewed: Basic Metabolic Panel:  Recent Labs Lab 04/09/15 1503 04/09/15 1519 04/10/15 0805 04/11/15 0607 04/12/15 0543  NA 146*  --  147* 148* 150*  K 2.9*  --  3.3* 3.2* 3.4*  CL 97*  --  105 107 105  CO2 33*  --  30 32 34*  GLUCOSE 107*  --  83 76 85  BUN 57*  --  55* 34* 21*  CREATININE 1.84*  --  1.57* 0.90 0.73  CALCIUM 9.8  --  9.0 8.3* 8.2*  MG  --  2.3  --   --   --    Liver Function Tests:  Recent Labs Lab 04/09/15 1503  AST 36  ALT 27  ALKPHOS 78  BILITOT 2.3*  PROT 6.2*  ALBUMIN 3.2*   No results for input(s): LIPASE, AMYLASE in the last 168 hours. No results for input(s): AMMONIA in the last 168 hours. CBC:  Recent Labs Lab 04/09/15 1503 04/10/15 0805 04/11/15 0607 04/12/15 0543  WBC 16.5* 15.8* 12.0* 12.6*  NEUTROABS 12.8*  --   --   --   HGB 17.0* 14.5 13.4 14.2  HCT 51.2*  44.3 41.5 44.3  MCV 92.1 92.7 92.8 93.3  PLT 254 147* 132* 137*   Cardiac Enzymes:  Recent Labs Lab 04/09/15 1519 04/09/15 1950 04/10/15 0205  TROPONINI 0.11* 0.09* 0.08*   BNP (last 3 results) No results for input(s): BNP in the last 8760 hours.  ProBNP (last 3 results) No results for input(s): PROBNP in the last 8760 hours.  CBG: No results for input(s): GLUCAP in the last 168 hours.  Recent Results (from the past 240 hour(s))  Culture, blood (routine x 2)     Status: None (Preliminary result)   Collection Time: 04/09/15  3:00 PM  Result Value Ref Range Status   Specimen Description BLOOD LEFT ANTECUBITAL  Final   Special Requests BOTTLES DRAWN AEROBIC ONLY 4CC  Final   Culture NO GROWTH 3 DAYS  Final   Report Status PENDING  Incomplete  Culture, blood (routine x 2)     Status: None (Preliminary result)   Collection Time: 04/09/15  7:50 PM  Result Value Ref Range Status   Specimen Description BLOOD LEFT FOREARM  Final   Special Requests BOTTLES DRAWN AEROBIC ONLY 6CC  Final   Culture NO GROWTH 3 DAYS  Final   Report Status PENDING  Incomplete  MRSA PCR Screening  Status: None   Collection Time: 04/09/15  7:50 PM  Result Value Ref Range Status   MRSA by PCR NEGATIVE NEGATIVE Final    Comment:        The GeneXpert MRSA Assay (FDA approved for NASAL specimens only), is one component of a comprehensive MRSA colonization surveillance program. It is not intended to diagnose MRSA infection nor to guide or monitor treatment for MRSA infections.   Urine culture     Status: None   Collection Time: 04/11/15  9:19 AM  Result Value Ref Range Status   Specimen Description URINE, CATHETERIZED  Final   Special Requests NONE  Final   Culture   Final    NO GROWTH 1 DAY [facility]    Report Status 04/12/2015 FINAL  Final     Studies: No results found.  Scheduled Meds: . amLODipine  5 mg Oral Daily  . ARIPiprazole  2 mg Oral Daily  .  aspirin  325 mg Oral Daily  . atenolol  100 mg Oral Daily  . cefTRIAXone (ROCEPHIN)  IV  1 g Intravenous Q24H  . cloNIDine  0.1 mg Oral TID  . heparin  5,000 Units Subcutaneous 3 times per day  . levothyroxine  75 mcg Oral QAC breakfast  . potassium chloride  40 mEq Oral Once  . sodium chloride  3 mL Intravenous Q12H   Continuous Infusions: . sodium chloride 75 mL/hr at 04/12/15 0526      Time spent: 25 minutes    Jeralyn BennettZAMORA, Davide Risdon  [facility] Pager [phone]. If 7PM-7AM, please contact night-coverage at [email or web address], password [email or web address] 04/12/2015, 9:53 AM  LOS: 2 days

## 2015-04-12 NOTE — Care Management Important Message (Signed)
Important Message  Patient Details  Name: Catherine Steele MRN: [id number] Date of Birth: 04-06-1919   Medicare Important Message Given:  Yes    [name], [name], RN 04/12/2015, 11:52 AM

## 2015-04-12 NOTE — Clinical Social Work Note (Signed)
CSW spoke with patient's nephew and Avon GullyHCPOA, Edwinna AreolaBritt Wilkins. Mr. Lillia MountainWilkins advised that he had spoken [facility] and would be interested in having Hospice serve patient at the facility.  Mr. Lillia MountainWilkins advised that he had spoken with patient's attending related to patient being appropriate for Hospice.  CSW discussed the purpose of Hospice being involved would be to provide patient with comfort during end of life care as Mr. Lillia MountainWilkins verbalized misunderstanding the purpose of hospice involvement.   CSW spoke with Lequita HaltMorgan at [facility] who advised that the facility would be willing to take patient back with Hospice involvement. She stated that the [facility]    CSW spoke with Mr. Lillia MountainWilkins who advised that the facility would be able to take patient back with Hospice care.    [facility] and provided referral information.  CSW sent clinical information regarding patient to the facility.  CS[facility] who advised that dementia could not be used as a Hospice dx per [employer] rules. She stated that she would use malnutrition since patient had not eaten in multiple days. She requested that if patient is discharged back to the facility this weekend to contact the on call Hospice Nurse at [phone].  Annice NeedySettle, Djimon Lundstrom D, [place] [phone]

## 2015-04-13 DIAGNOSIS — N179 Acute kidney failure, unspecified: Principal | ICD-10-CM

## 2015-04-13 DIAGNOSIS — Z66 Do not resuscitate: Secondary | ICD-10-CM

## 2015-04-13 DIAGNOSIS — R7989 Other specified abnormal findings of blood chemistry: Secondary | ICD-10-CM

## 2015-04-13 DIAGNOSIS — G9340 Encephalopathy, unspecified: Secondary | ICD-10-CM

## 2015-04-13 MED ORDER — POTASSIUM CL IN DEXTROSE 5% 20 MEQ/L IV SOLN
20.0000 meq | INTRAVENOUS | Status: DC
  Administered 2015-04-13 – 2015-04-14 (×2): 20 meq via INTRAVENOUS

## 2015-04-13 NOTE — Progress Notes (Signed)
[facility] PROGRESS NOTE  Catherine Steele XLK:[id number] DOB: [birth year]    PCP:   [name], FNP   HPI:   81 year old female with baseline severe dementia resident of assisted living memory care unit with history of carotid stenosis, hypertension, anxiety was brought to the ED with progressive weakness and decreased by mouth intake for the past 2 days. Patient has severe dementia at baseline but has worsened in the past several days. Patient found to have leukocytosis with hypernatremia , hypokalemia and acute kidney injury. Also had lactic acid of 2.4 meeting criteria for SIRS. She also had mildly elevated troponin possibly in the setting of dehydration. Admitted to hospitalist service Since admission, her AKI has resolved.  Her blood and urine cultures were negative, K has been repleted, but her Na is still elevated at 150's. Her POAHCP has agreed that she should be comfort care, hospice care, and no aggressive measures should be instituted.   Rewiew of Systems:  Unable.   Past Medical History  Diagnosis Date  . Anxiety   . Hyperlipidemia   . Vertigo   . Carotid stenosis   . Vitamin D deficiency   . Hypertension   . Neuromuscular disorder (HCC)     post herpetic neuralgia  . Gallstones   . Arthritis   . Carotid stenosis   . Vitamin D deficiency   . DNR (do not resuscitate)   . Confusion     History reviewed. No pertinent past surgical history.  Medications:  HOME MEDS: Prior to Admission medications   Medication Sig Start Date End Date Taking? Authorizing Provider  ALPRAZolam Prudy Feeler) 0.5 MG tablet Take 1 tablet (0.5 mg total) by mouth 2 (two) times daily as needed for anxiety (for restlessness and agitation). 02/20/15  Yes Junie Spencer, FNP  amLODipine (NORVASC) 5 MG tablet TAKE ONE TABLET BY MOUTH ONE  TIME DAILY 08/20/14  Yes Junie Spencer, FNP  ARIPiprazole (ABILIFY) 2 MG tablet Take 2 mg by mouth daily.   Yes Historical Provider, MD  aspirin 325 MG  tablet Take 325 mg by mouth daily.   Yes Historical Provider, MD  atenolol (TENORMIN) 50 MG tablet TAKE 2 TABLETS BY MOUTH DAILY AS DIRECTED Patient taking differently: Take 100 mg by mouth daily.  08/14/14  Yes Junie Spencer, FNP  Cholecalciferol (VITAMIN D3) 2000 UNITS TABS Take 1 tablet by mouth daily.   Yes Historical Provider, MD  cloNIDine (CATAPRES) 0.1 MG tablet TAKE ONE TABLET BY MOUTH THREE TIMES DAILY 08/27/14  Yes Junie Spencer, FNP  fluticasone (FLONASE) 50 MCG/ACT nasal spray Place 2 sprays into both nostrils daily. 02/23/14  Yes Junie Spencer, FNP  levothyroxine (SYNTHROID, LEVOTHROID) 75 MCG tablet TAKE 1 TABLET (75 MCG TOTAL)  BY MOUTH DAILY. 11/27/14  Yes Junie Spencer, FNP  hydrocortisone 2.5 % cream Apply topically 2 (two) times daily. Let patient know when rx is ready for pick up Patient not taking: Reported on 04/09/2015 03/19/15   Junie Spencer, FNP  sulfamethoxazole-trimethoprim (BACTRIM DS,SEPTRA DS) 800-160 MG tablet Take 1 tablet by mouth 2 (two) times daily. Patient not taking: Reported on 04/09/2015 02/21/15   Junie Spencer, FNP     Allergies:  Allergies  Allergen Reactions  . Codeine     Unknown reaction  . Niacin And Related     Unknown reaction    Social History:   reports that she has never smoked. She has never used smokeless tobacco. She reports that she does  not drink alcohol or use illicit drugs.  Family History: History reviewed. No pertinent family history.   Physical Exam: Filed Vitals:   04/13/15 0513 04/13/15 0900 04/13/15 1057 04/13/15 1510  BP: 142/69  178/65 162/70  Pulse: 81  67 72  Temp: 98.3 F (36.8 C)   98.5 F (36.9 C)  TempSrc: Axillary   Oral  Resp: 16   18  Height:  5' (1.524 m)    Weight:      SpO2: 95%   96%   Blood pressure 162/70, pulse 72, temperature 98.5 F (36.9 C), temperature source Oral, resp. rate 18, height 5' (1.524 m), weight 56.4 kg (124 lb 5.4 oz), SpO2 96 %.  GEN:  Pleasant patient lying in  the stretcher in no acute distress; cooperative with exam. PSYCH: Confused. does not appear anxious or depressed; affect is appropriate. HEENT: Mucous membranes pink and anicteric; PERRLA; EOM intact; no cervical lymphadenopathy nor thyromegaly or carotid bruit; no JVD; There were no stridor. Neck is very supple. Breasts:: Not examined CHEST WALL: No tenderness CHEST: Normal respiration, clear to auscultation bilaterally.  HEART: Regular rate and rhythm.  There are no murmur, rub, or gallops.   BACK: No kyphosis or scoliosis; no CVA tenderness ABDOMEN: soft and non-tender; no masses, no organomegaly, normal abdominal bowel sounds; no pannus; no intertriginous candida. There is no rebound and no distention. Rectal Exam: Not done EXTREMITIES: No bone or joint deformity; age-appropriate arthropathy of the hands and knees; no edema; no ulcerations.  There is no calf tenderness. Genitalia: not examined PULSES: 2+ and symmetric SKIN: Normal hydration no rash or ulceration CNS: Cranial nerves 2-12 grossly intact no focal lateralizing neurologic deficit.  Speech is fluent; uvula elevated with phonation, facial symmetry and tongue midline. DTR are normal bilaterally, cerebella exam is intact, barbinski is negative and strengths are equaled bilaterally.  No sensory loss.   Labs on Admission:  Basic Metabolic Panel:  Recent Labs Lab 04/09/15 1503 04/09/15 1519 04/10/15 0805 04/11/15 0607 04/12/15 0543  NA 146*  --  147* 148* 150*  K 2.9*  --  3.3* 3.2* 3.4*  CL 97*  --  105 107 105  CO2 33*  --  30 32 34*  GLUCOSE 107*  --  83 76 85  BUN 57*  --  55* 34* 21*  CREATININE 1.84*  --  1.57* 0.90 0.73  CALCIUM 9.8  --  9.0 8.3* 8.2*  MG  --  2.3  --   --   --    Liver Function Tests:  Recent Labs Lab 04/09/15 1503  AST 36  ALT 27  ALKPHOS 78  BILITOT 2.3*  PROT 6.2*  ALBUMIN 3.2*   CBC:  Recent Labs Lab 04/09/15 1503 04/10/15 0805 04/11/15 0607 04/12/15 0543  WBC 16.5* 15.8*  12.0* 12.6*  NEUTROABS 12.8*  --   --   --   HGB 17.0* 14.5 13.4 14.2  HCT 51.2* 44.3 41.5 44.3  MCV 92.1 92.7 92.8 93.3  PLT 254 147* 132* 137*   Cardiac Enzymes:  Recent Labs Lab 04/09/15 1519 04/09/15 1950 04/10/15 0205  TROPONINI 0.11* 0.09* 0.08*   Assessment/Plan Present on Admission:  . AKI (acute kidney injury) (HCC) . DNR (do not resuscitate) . Elevated troponin . Hypokalemia . Acute encephalopathy . Physical deconditioning  PLAN:  Will continue with free water infusion to correct her hypernatremia.  Will d/c her IV antibiotics at this time.  Continue with current therapy.  Plan to have her return  to her SNF tomorrow.  I tried to call her POA/HCP but no answer. Continue with DNR and focus on comfort care if she deteriorates.   Other plans as per orders.  Code Status: Eartha InchNR>   Shukri Nistler, MD. [facility] Pager [phone] 7pm to 7am.  04/13/2015, 3:42 PM

## 2015-04-14 DIAGNOSIS — I10 Essential (primary) hypertension: Secondary | ICD-10-CM

## 2015-04-14 LAB — BASIC METABOLIC PANEL
Anion gap: 4 — ABNORMAL LOW (ref 5–15)
BUN: 11 mg/dL (ref 6–20)
CO2: 35 mmol/L — ABNORMAL HIGH (ref 22–32)
Calcium: 7.2 mg/dL — ABNORMAL LOW (ref 8.9–10.3)
Chloride: 100 mmol/L — ABNORMAL LOW (ref 101–111)
Creatinine, Ser: 0.59 mg/dL (ref 0.44–1.00)
GFR calc Af Amer: >60 mL/min (ref >60)
GLUCOSE: 118 mg/dL — AB (ref 65–99)
POTASSIUM: 2.7 mmol/L — AB (ref 3.5–5.1)
Sodium: 139 mmol/L (ref 135–145)

## 2015-04-14 LAB — CULTURE, BLOOD (ROUTINE X 2)
CULTURE: NO GROWTH
Culture: NO GROWTH

## 2015-04-14 MED ORDER — POTASSIUM CHLORIDE 10 MEQ/100ML IV SOLN
10.0000 meq | INTRAVENOUS | Status: AC
  Administered 2015-04-14 (×4): 10 meq via INTRAVENOUS
  Filled 2015-04-14: qty 100

## 2015-04-14 NOTE — Progress Notes (Signed)
Informed Dr.Le of critical potassium of 2.7. No new orders at this time. Will continue to monitor

## 2015-04-14 NOTE — Progress Notes (Signed)
[facility] PROGRESS NOTE  Catherine Steele:[id number] DOB: 20-Sep-1918    PCP:   [name], FNP   HPI:   81 year old female with baseline severe dementia resident of assisted living memory care unit with history of carotid stenosis, hypertension, anxiety was brought to the ED with progressive weakness and decreased by mouth intake for the past 2 days. Patient has severe dementia at baseline but has worsened in the past several days. Patient found to have leukocytosis with hypernatremia , hypokalemia and acute kidney injury. Also had lactic acid of 2.4 meeting criteria for SIRS. She also had mildly elevated troponin possibly in the setting of dehydration. Admitted to hospitalist service Since admission, her AKI has resolved. Her blood and urine cultures were negative, K has been repleted, but still low. Her Na is still elevated at 150's. Her POAHCP has agreed that she should be comfort care, hospice care, and no aggressive measures should be instituted.   Rewiew of Systems:  Constitutional: Negative for malaise, fever and chills. No significant weight loss or weight gain Eyes: Negative for eye pain, redness and discharge, diplopia, visual changes, or flashes of light. ENMT: Negative for ear pain, hoarseness, nasal congestion, sinus pressure and sore throat. No headaches; tinnitus, drooling, or problem swallowing. Cardiovascular: Negative for chest pain, palpitations, diaphoresis, dyspnea and peripheral edema. ; No orthopnea, PND Respiratory: Negative for cough, hemoptysis, wheezing and stridor. No pleuritic chestpain. Gastrointestinal: Negative for nausea, vomiting, diarrhea, constipation, abdominal pain, melena, blood in stool, hematemesis, jaundice and rectal bleeding.    Genitourinary: Negative for frequency, dysuria, incontinence,flank pain and hematuria; Musculoskeletal: Negative for back pain and neck pain. Negative for swelling and trauma.;  Skin: . Negative for pruritus,  rash, abrasions, bruising and skin lesion.; ulcerations Neuro: Negative for headache, lightheadedness and neck stiffness. Negative for weakness, altered level of consciousness , altered mental status, extremity weakness, burning feet, involuntary movement, seizure and syncope.  Psych: negative for anxiety, depression, insomnia, tearfulness, panic attacks, hallucinations, paranoia, suicidal or homicidal ideation    Past Medical History  Diagnosis Date  . Anxiety   . Hyperlipidemia   . Vertigo   . Carotid stenosis   . Vitamin D deficiency   . Hypertension   . Neuromuscular disorder (HCC)     post herpetic neuralgia  . Gallstones   . Arthritis   . Carotid stenosis   . Vitamin D deficiency   . DNR (do not resuscitate)   . Confusion     History reviewed. No pertinent past surgical history.  Medications:  HOME MEDS: Prior to Admission medications   Medication Sig Start Date End Date Taking? Authorizing Provider  ALPRAZolam Prudy Feeler) 0.5 MG tablet Take 1 tablet (0.5 mg total) by mouth 2 (two) times daily as needed for anxiety (for restlessness and agitation). 02/20/15  Yes Junie Spencer, FNP  amLODipine (NORVASC) 5 MG tablet TAKE ONE TABLET BY MOUTH ONE  TIME DAILY 08/20/14  Yes Junie Spencer, FNP  ARIPiprazole (ABILIFY) 2 MG tablet Take 2 mg by mouth daily.   Yes Historical Provider, MD  aspirin 325 MG tablet Take 325 mg by mouth daily.   Yes Historical Provider, MD  atenolol (TENORMIN) 50 MG tablet TAKE 2 TABLETS BY MOUTH DAILY AS DIRECTED Patient taking differently: Take 100 mg by mouth daily.  08/14/14  Yes Junie Spencer, FNP  Cholecalciferol (VITAMIN D3) 2000 UNITS TABS Take 1 tablet by mouth daily.   Yes Historical Provider, MD  cloNIDine (CATAPRES) 0.1 MG tablet TAKE ONE  TABLET BY MOUTH THREE TIMES DAILY 08/27/14  Yes Junie Spencerhristy A Hawks, FNP  fluticasone (FLONASE) 50 MCG/ACT nasal spray Place 2 sprays into both nostrils daily. 02/23/14  Yes Junie Spencerhristy A Hawks, FNP  levothyroxine  (SYNTHROID, LEVOTHROID) 75 MCG tablet TAKE 1 TABLET (75 MCG TOTAL)  BY MOUTH DAILY. 11/27/14  Yes Junie Spencerhristy A Hawks, FNP  hydrocortisone 2.5 % cream Apply topically 2 (two) times daily. Let patient know when rx is ready for pick up Patient not taking: Reported on 04/09/2015 03/19/15   Junie Spencerhristy A Hawks, FNP  sulfamethoxazole-trimethoprim (BACTRIM DS,SEPTRA DS) 800-160 MG tablet Take 1 tablet by mouth 2 (two) times daily. Patient not taking: Reported on 04/09/2015 02/21/15   Junie Spencerhristy A Hawks, FNP     Allergies:  Allergies  Allergen Reactions  . Codeine     Unknown reaction  . Niacin And Related     Unknown reaction    Social History:   reports that she has never smoked. She has never used smokeless tobacco. She reports that she does not drink alcohol or use illicit drugs.  Family History: History reviewed. No pertinent family history.   Physical Exam: Filed Vitals:   04/13/15 1057 04/13/15 1510 04/13/15 2149 04/14/15 0626  BP: 178/65 162/70 168/73 115/80  Pulse: 67 72 87 64  Temp:  98.5 F (36.9 C) 97.7 F (36.5 C) 98.4 F (36.9 C)  TempSrc:  Oral Oral Oral  Resp:  18 18 18   Height:      Weight:      SpO2:  96% 100% 100%   Blood pressure 115/80, pulse 64, temperature 98.4 F (36.9 C), temperature source Oral, resp. rate 18, height 5' (1.524 m), weight 56.4 kg (124 lb 5.4 oz), SpO2 100 %.  GEN:  Pleasant  patient lying in the stretcher in no acute distress; cooperative with exam. PSYCH:  alert and oriented x4; does not appear anxious or depressed; affect is appropriate. HEENT: Mucous membranes pink and anicteric; PERRLA; EOM intact; no cervical lymphadenopathy nor thyromegaly or carotid bruit; no JVD; There were no stridor. Neck is very supple. Breasts:: Not examined CHEST WALL: No tenderness CHEST: Normal respiration, clear to auscultation bilaterally.  HEART: Regular rate and rhythm.  There are no murmur, rub, or gallops.   BACK: No kyphosis or scoliosis; no CVA  tenderness ABDOMEN: soft and non-tender; no masses, no organomegaly, normal abdominal bowel sounds; no pannus; no intertriginous candida. There is no rebound and no distention. Rectal Exam: Not done EXTREMITIES: No bone or joint deformity; age-appropriate arthropathy of the hands and knees; no edema; no ulcerations.  There is no calf tenderness. Genitalia: not examined PULSES: 2+ and symmetric SKIN: Normal hydration no rash or ulceration CNS: Cranial nerves 2-12 grossly intact no focal lateralizing neurologic deficit.  Speech is fluent; uvula elevated with phonation, facial symmetry and tongue midline. DTR are normal bilaterally, cerebella exam is intact, barbinski is negative and strengths are equaled bilaterally.  No sensory loss.   Labs on Admission:  Basic Metabolic Panel:  Recent Labs Lab 04/09/15 1503 04/09/15 1519 04/10/15 0805 04/11/15 0607 04/12/15 0543 04/14/15 0603  NA 146*  --  147* 148* 150* 139  K 2.9*  --  3.3* 3.2* 3.4* 2.7*  CL 97*  --  105 107 105 100*  CO2 33*  --  30 32 34* 35*  GLUCOSE 107*  --  83 76 85 118*  BUN 57*  --  55* 34* 21* 11  CREATININE 1.84*  --  1.57* 0.90 0.73 0.59  CALCIUM 9.8  --  9.0 8.3* 8.2* 7.2*  MG  --  2.3  --   --   --   --    Liver Function Tests:  Recent Labs Lab 04/09/15 1503  AST 36  ALT 27  ALKPHOS 78  BILITOT 2.3*  PROT 6.2*  ALBUMIN 3.2*   CBC:  Recent Labs Lab 04/09/15 1503 04/10/15 0805 04/11/15 0607 04/12/15 0543  WBC 16.5* 15.8* 12.0* 12.6*  NEUTROABS 12.8*  --   --   --   HGB 17.0* 14.5 13.4 14.2  HCT 51.2* 44.3 41.5 44.3  MCV 92.1 92.7 92.8 93.3  PLT 254 147* 132* 137*   Cardiac Enzymes:  Recent Labs Lab 04/09/15 1519 04/09/15 1950 04/10/15 0205  TROPONINI 0.11* 0.09* 0.08*   Assessment/Plan Present on Admission:  . AKI (acute kidney injury) (HCC) . DNR (do not resuscitate) . Elevated troponin . Hypokalemia . Acute encephalopathy . Physical deconditioning   PLAN: Hypernatremia:  Na  is corrected. Antibiotics were d/c'ed Continue with current therapy. Plan to have her return to her SNF tomorrow. Spoke with HCP/POA today and confirm GOC.  Will replete her K more today, and plan to d/c her tomorrow.  Continue with DNR and focus on comfort care if she deteriorates.    Other plans as per orders.  Code Status: DNR   Houston Siren, MD. [facility] Pager [phone] 7pm to 7am.  04/14/2015, 11:46 AM

## 2015-04-15 DIAGNOSIS — Z515 Encounter for palliative care: Secondary | ICD-10-CM | POA: Insufficient documentation

## 2015-04-15 LAB — BASIC METABOLIC PANEL
Anion gap: 4 — ABNORMAL LOW (ref 5–15)
BUN: 8 mg/dL (ref 6–20)
CHLORIDE: 100 mmol/L — AB (ref 101–111)
CO2: 36 mmol/L — AB (ref 22–32)
Calcium: 7.7 mg/dL — ABNORMAL LOW (ref 8.9–10.3)
Creatinine, Ser: 0.59 mg/dL (ref 0.44–1.00)
GFR calc Af Amer: >60 mL/min (ref >60)
GFR calc non Af Amer: >60 mL/min (ref >60)
GLUCOSE: 79 mg/dL (ref 65–99)
POTASSIUM: 3.5 mmol/L (ref 3.5–5.1)
Sodium: 140 mmol/L (ref 135–145)

## 2015-04-15 NOTE — Progress Notes (Signed)
Speech Language Pathology Treatment: Dysphagia  Catherine Steele Details Name: Catherine Steele MRN: [id number] DOB: 1919/03/27 Today's Date: 04/15/2015 Time: 2111-5520 SLP Time Calculation (min) (ACUTE ONLY): 24 min  Assessment / Plan / Recommendation Clinical Impression  Catherine Steele was seen at bedside with her lunch meal (puree and NTL). She had consumed a modest amount prior to SLP arrival, but was agreeable to "try a little more". She reports that she is "not a big eater" and that "people are always getting on me" about eating enough. She indicates that her "teeth are bad" so she prefers puree diet. She acknowledges that her liquids are thickened now and she is "okay with that". Catherine Steele presents with mild wet vocal quality over lunch meal and needs encouragement to clear her throat and swallow. Vocal quality improves with throat clear. Thickened liquids continue to be appropriate (nectar) for Catherine Steele. SLP will sign off as Catherine Steele likely to discharge to [facility] later today per chart review.   HPI HPI: Catherine Steele is a 81 y/o female with severe dementia at baseline; Per report by ED physician, nursing home, and Catherine Steele's healthcare POA , Catherine Steele was in her normal state of health up to a couple of days ago when she began to show evidence of generalized fatigue, weakness and very little oral intake. Catherine Steele is without focal complaint but does complain of generalized pain. Catherine Steele is confused at baseline but this has worsened over the last several days. Symptoms are constant and getting worse.       SLP Plan  All goals met     Recommendations  Diet recommendations: Dysphagia 1 (puree);Nectar-thick liquid Liquids provided via: Straw;Cup Medication Administration: Crushed with puree Supervision: Staff to assist with self feeding;Full supervision/cueing for compensatory strategies;Trained caregiver to feed Catherine Steele Compensations: Slow rate;Small sips/bites;Clear throat intermittently;Multiple dry swallows after each  bite/sip Postural Changes and/or Swallow Maneuvers: Seated upright 90 degrees;Upright 30-60 min after meal              Oral Care Recommendations: Oral care BID Follow up Recommendations: None Plan: All goals met   Haaris Metallo 04/15/2015, 12:55 PM

## 2015-04-15 NOTE — Clinical Social Work Note (Addendum)
Pt d/c today back to [facility] [place] with Hospice. Pt's HCPOA, Moshe CiproBritt and Cathy at facility aware and agreeable. CSW discussed diet with MD who requests mechanical soft as pt is comfort care. Facility notified. Awaiting oxygen delivery to ALF and then [facility]. Out of facility DNR sent with pt. CSW faxed d/c summary and FL2.   Derenda FennelKara Evelynn Hench, LCSW [phone]

## 2015-04-15 NOTE — Discharge Summary (Signed)
Physician Discharge Summary  [facility] ZOX:[id number] DOB: [birth year] DOA: 04/09/2015  PCP: [name], FNP  Admit date: 04/09/2015 Discharge date: 04/15/2015  Time spent:35 minutes  Recommendations for Outpatient Follow-up:  1. Follow up with PCP next week.    Discharge Diagnoses:  Principal Problem:   Acute encephalopathy Active Problems:   DNR (do not resuscitate)   AKI (acute kidney injury) (HCC)   Elevated troponin   Hypokalemia   Physical deconditioning   Hypernatremia   Essential hypertension   Pressure ulcer   Palliative care encounter   Discharge Condition: imnproved.  She was able to eat by herself, no SOB.   Diet recommendation: soft mechanical.    Filed Weights   04/09/15 1831  Weight: 56.4 kg (124 lb 5.4 oz)    History of present illness: Patient was admitted by Dr Evelena Peat for altered mental status and weakness on Apr 09, 2015.  As per her H and P:  " History limited due to patient's baseline dementia and worsening altered state due to illness Per report by ED physician, nursing home, and patient's healthcare POA - Karl Luke, patient was in her normal state of health up to a couple of days ago when she began to show evidence of generalized fatigue, weakness and very little oral intake. Patient is without focal complaint but does complain of generalized pain. Patient is confused at baseline but this has worsened over the last several days. Symptoms are constant and getting worse. Of note per Moshe Cipro, patient has had a market mental decline over the last 5-6 months.  symptoms are constant and getting worse. Nothing makes it better or worse.   Hospital Course:  81 year old female with baseline severe dementia resident of assisted living memory care unit with history of carotid stenosis, hypertension, anxiety was brought to the ED with progressive weakness and decreased by mouth intake for the past 2 days. Patient has severe dementia at baseline but  has worsened in the past several days. Patient found to have leukocytosis with hypernatremia , hypokalemia and acute kidney injury. Also had lactic acid of 2.4 meeting criteria for SIRS. She also had mildly elevated troponin possibly in the setting of dehydration. Since admission, her AKI has resolved. Her blood and urine cultures were negative, K has been repleted, and at the time of discharge, it was normal. Her Na was corrected using D5W.  She was able to sit up and eat by herself. Her POAHCP has agreed that she should be comfort care, hospice care, and no aggressive measures should be instituted. She is now ready for discharge with hospice and PCP follow up.  Thank you and Good Day.    Discharge Exam: Filed Vitals:   04/14/15 2218 04/15/15 0550  BP: 130/67 143/51  Pulse: 63 62  Temp: 97.7 F (36.5 C) 97.5 F (36.4 C)  Resp: 16 20   Discharge Instructions   Discharge Instructions    Diet - low sodium heart healthy    Complete by:  As directed      Increase activity slowly    Complete by:  As directed           Current Discharge Medication List    CONTINUE these medications which have NOT CHANGED   Details  ALPRAZolam (XANAX) 0.5 MG tablet Take 1 tablet (0.5 mg total) by mouth 2 (two) times daily as needed for anxiety (for restlessness and agitation). Qty: 60 tablet, Refills: 1    amLODipine (NORVASC) 5 MG tablet TAKE ONE  TABLET BY MOUTH ONE  TIME DAILY Qty: 30 tablet, Refills: 3    ARIPiprazole (ABILIFY) 2 MG tablet Take 2 mg by mouth daily.    aspirin 325 MG tablet Take 325 mg by mouth daily.    atenolol (TENORMIN) 50 MG tablet TAKE 2 TABLETS BY MOUTH DAILY AS DIRECTED Qty: 60 tablet, Refills: 5   Associated Diagnoses: Essential hypertension    Cholecalciferol (VITAMIN D3) 2000 UNITS TABS Take 1 tablet by mouth daily.    cloNIDine (CATAPRES) 0.1 MG tablet TAKE ONE TABLET BY MOUTH THREE TIMES DAILY Qty: 90 tablet, Refills: 3    fluticasone (FLONASE) 50 MCG/ACT  nasal spray Place 2 sprays into both nostrils daily. Qty: 16 g, Refills: 6   Associated Diagnoses: Other seasonal allergic rhinitis    levothyroxine (SYNTHROID, LEVOTHROID) 75 MCG tablet TAKE 1 TABLET (75 MCG TOTAL)  BY MOUTH DAILY. Qty: 90 tablet, Refills: 1      STOP taking these medications     hydrocortisone 2.5 % cream      sulfamethoxazole-trimethoprim (BACTRIM DS,SEPTRA DS) 800-160 MG tablet        Allergies  Allergen Reactions  . Codeine     Unknown reaction  . Niacin And Related     Unknown reaction      The results of significant diagnostics from this hospitalization (including imaging, microbiology, ancillary and laboratory) are listed below for reference.    Significant Diagnostic Studies: Dg Chest Portable 1 View  04/09/2015  CLINICAL DATA:  generalized weakness, fatigue, and malaise since yesterday. Facility reports unable to ambulate ---- lack of appetite for the past two days. Pt lethargic upon assessment. EXAM: PORTABLE CHEST - 1 VIEW COMPARISON:  02/01/2015 FINDINGS: Lungs are clear. Heart size and mediastinal contours are within normal limits. No effusion. Visualized skeletal structures are unremarkable. IMPRESSION: No acute cardiopulmonary disease. Electronically Signed   By: Corlis Leak M.D.   On: 04/09/2015 16:04    Microbiology: Recent Results (from the past 240 hour(s))  Culture, blood (routine x 2)     Status: None   Collection Time: 04/09/15  3:00 PM  Result Value Ref Range Status   Specimen Description BLOOD LEFT ANTECUBITAL  Final   Special Requests BOTTLES DRAWN AEROBIC ONLY 4CC  Final   Culture NO GROWTH 5 DAYS  Final   Report Status 04/14/2015 FINAL  Final  Culture, blood (routine x 2)     Status: None   Collection Time: 04/09/15  7:50 PM  Result Value Ref Range Status   Specimen Description BLOOD LEFT FOREARM  Final   Special Requests BOTTLES DRAWN AEROBIC ONLY 6CC  Final   Culture NO GROWTH 5 DAYS  Final   Report Status 04/14/2015 FINAL   Final  MRSA PCR Screening     Status: None   Collection Time: 04/09/15  7:50 PM  Result Value Ref Range Status   MRSA by PCR NEGATIVE NEGATIVE Final    Comment:        The GeneXpert MRSA Assay (FDA approved for NASAL specimens only), is one component of a comprehensive MRSA colonization surveillance program. It is not intended to diagnose MRSA infection nor to guide or monitor treatment for MRSA infections.   Urine culture     Status: None   Collection Time: 04/11/15  9:19 AM  Result Value Ref Range Status   Specimen Description URINE, CATHETERIZED  Final   Special Requests NONE  Final   Culture   Final    NO GROWTH 1 DAY  [facility]    Report Status 04/12/2015 FINAL  Final     Labs: Basic Metabolic Panel:  Recent Labs Lab 04/09/15 1519 04/10/15 0805 04/11/15 [id number] 04/12/15 0543 04/14/15 0603 04/15/15 0609  NA  --  147* 148* 150* 139 140  K  --  3.3* 3.2* 3.4* 2.7* 3.5  CL  --  105 107 105 100* 100*  CO2  --  30 32 34* 35* 36*  GLUCOSE  --  83 76 85 118* 79  BUN  --  55* 34* 21* 11 8  CREATININE  --  1.57* 0.90 0.73 0.59 0.59  CALCIUM  --  9.0 8.3* 8.2* 7.2* 7.7*  MG 2.3  --   --   --   --   --    Liver Function Tests:  Recent Labs Lab 04/09/15 1503  AST 36  ALT 27  ALKPHOS 78  BILITOT 2.3*  PROT 6.2*  ALBUMIN 3.2*   No results for input(s): LIPASE, AMYLASE in the last 168 hours. No results for input(s): AMMONIA in the last 168 hours. CBC:  Recent Labs Lab 04/09/15 1503 04/10/15 0805 04/11/15 0607 04/12/15 0543  WBC 16.5* 15.8* 12.0* 12.6*  NEUTROABS 12.8*  --   --   --   HGB 17.0* 14.5 13.4 14.2  HCT 51.2* 44.3 41.5 44.3  MCV 92.1 92.7 92.8 93.3  PLT 254 147* 132* 137*   Cardiac Enzymes:  Recent Labs Lab 04/09/15 1519 04/09/15 1950 04/10/15 0205  TROPONINI 0.11* 0.09* 0.08*   Signed:  Alliyah Roesler  [facility] 04/15/2015, 1:27 PM

## 2015-04-15 NOTE — Care Management Note (Signed)
Case Management Note  Patient Details  Name: Catherine Steele MRN:Norva Riffle [id number] Date of Birth: 06-02-1918  Pt discharging to [facility] with Hospice services. Hospice referral has already been made, CM faxed DC summary. CSW has arranged for return to facility. No further CM needs at this time.   Expected Discharge Date:                  Expected Discharge Plan:  Assisted Living / Rest Home  In-House Referral:  Clinical Social Work  Discharge planning Services  CM Consult  Post Acute Care Choice:  NA Choice offered to:  NA  DME Arranged:    DME Agency:     HH Arranged:    HH Agency:     Status of Service:  Completed, signed off  Medicare Important Message Given:  Yes Date Medicare IM Given:    Medicare IM give by:    Date Additional Medicare IM Given:    Additional Medicare Important Message give by:     If discussed at Long Length of Stay Meetings, dates discussed:    Additional Comments:  Malcolm MetroChildress, Ashley Bultema Demske, RN 04/15/2015, 2:49 PM

## 2015-04-15 NOTE — [place] FL2 (Signed)
[place] MEDICAID FL2 LEVEL OF CARE SCREENING TOOL     IDENTIFICATION  Patient Name: Catherine Steele Birthdate: Aug 21, 1918 Sex: female Admission Date (Current Location): 04/09/2015  [facility] [place]     Facility and Address:  [facility],  [address], [place] [id number]      Provider Number: [id number]  Attending Physician Name and Address:  Houston SirenPeter Asli Tokarski, MD  Relative Name and Phone Number:       Current Level of Care: Hospital Recommended Level of Care: Assisted Living Facility Prior Approval Number:    Date Approved/Denied:   PASRR Number:    Discharge Plan: Other (Comment) (assisted living)    Current Diagnoses: Patient Active Problem List   Diagnosis Date Noted  . Palliative care encounter   . Pressure ulcer 04/10/2015  . Hypernatremia 04/09/2015  . Essential hypertension 04/09/2015  . Dehydration   . Elevated troponin   . Hypokalemia   . Physical deconditioning   . FTT (failure to thrive) in adult 02/03/2015  . UTI (urinary tract infection) 02/02/2015  . Acute encephalopathy 02/02/2015  . AKI (acute kidney injury) (HCC) 02/02/2015  . Volume depletion 02/01/2015  . DNR (do not resuscitate) 02/01/2015  . Hypotension 02/01/2015  . Tinea corporis 04/11/2013  . History of TIAs 01/27/2013  . Hypothyroidism 10/13/2012  . HTN (hypertension) 08/23/2012  . Vitamin D deficiency 08/23/2012  . Generalized anxiety disorder 08/23/2012  . Gallstones 08/23/2012  . Carotid stenosis 08/23/2012  . Arthritis 08/23/2012  . Post herpetic neuralgia 08/23/2012    Orientation ACTIVITIES/SOCIAL BLADDER RESPIRATION    Self  Family supportive Incontinent O2 (As needed) (2L)  BEHAVIORAL SYMPTOMS/MOOD NEUROLOGICAL BOWEL NUTRITION STATUS  Other (Comment) (n/a)  (n/a) Incontinent Diet (Mechanical soft)  PHYSICIAN VISITS COMMUNICATION OF NEEDS Height & Weight Skin    Verbally 5\' 5"  (165.1 cm) 124 lbs. Bruising to bilateral extremities. Abrasion to  perineum. Excoriated sacrum. Stage II to sacrum with foam dressing. Stage I to left buttocks.          AMBULATORY STATUS RESPIRATION     (not ambulatory) O2 (As needed) (2L)      Personal Care Assistance Level of Assistance  Bathing, Feeding, Dressing Bathing Assistance: Maximum assistance Feeding assistance: Maximum assistance Dressing Assistance: Maximum assistance      Functional Limitations Info  Sight, Hearing, Speech Sight Info: Impaired Hearing Info: Adequate Speech Info: Adequate       SPECIAL CARE FACTORS FREQUENCY   (hospice)                   Additional Factors Info  Psychotropic Code Status Info: DNR Allergies Info: Codeine, Niacin and related Psychotropic Info: Xanax         Current Medications (04/15/2015):  This is the current hospital active medication list Current Facility-Administered Medications  Medication Dose Route Frequency Provider Last Rate Last Dose  . acetaminophen (TYLENOL) tablet 650 mg  650 mg Oral Q6H PRN Ozella Rocksavid J Merrell, MD       Or  . acetaminophen (TYLENOL) suppository 650 mg  650 mg Rectal Q6H PRN Ozella Rocksavid J Merrell, MD      . amLODipine (NORVASC) tablet 5 mg  5 mg Oral Daily Ozella Rocksavid J Merrell, MD   5 mg at 04/15/15 1032  . ARIPiprazole (ABILIFY) tablet 2 mg  2 mg Oral Daily Ozella Rocksavid J Merrell, MD   2 mg at 04/15/15 1032  . aspirin tablet 325 mg  325 mg Oral Daily Ozella Rocksavid J Merrell, MD  325 mg at 04/15/15 1031  . atenolol (TENORMIN) tablet 100 mg  100 mg Oral Daily Ozella Rocks, MD   100 mg at 04/15/15 1031  . cloNIDine (CATAPRES) tablet 0.1 mg  0.1 mg Oral TID Ozella Rocks, MD   0.1 mg at 04/15/15 1031  . heparin injection 5,000 Units  5,000 Units Subcutaneous 3 times per day Ozella Rocks, MD   5,000 Units at 04/15/15 1311  . levothyroxine (SYNTHROID, LEVOTHROID) tablet 75 mcg  75 mcg Oral QAC breakfast Ozella Rocks, MD   75 mcg at 04/15/15 1032  . ondansetron (ZOFRAN) tablet 4 mg  4 mg Oral Q6H PRN Ozella Rocks, MD        Or  . ondansetron [facility]) injection 4 mg  4 mg Intravenous Q6H PRN Ozella Rocks, MD      . potassium chloride SA (K-DUR,KLOR-CON) CR tablet 40 mEq  40 mEq Oral Once Jeralyn Bennett, MD   40 mEq at 04/11/15 1504  . sodium chloride 0.9 % injection 3 mL  3 mL Intravenous Q12H Ozella Rocks, MD   3 mL at 04/15/15 1000     Discharge Medications: Current Discharge Medication List    CONTINUE these medications which have NOT CHANGED   Details  ALPRAZolam (XANAX) 0.5 MG tablet Take 1 tablet (0.5 mg total) by mouth 2 (two) times daily as needed for anxiety (for restlessness and agitation). Qty: 60 tablet, Refills: 1    amLODipine (NORVASC) 5 MG tablet TAKE ONE TABLET BY MOUTH ONE TIME DAILY Qty: 30 tablet, Refills: 3    ARIPiprazole (ABILIFY) 2 MG tablet Take 2 mg by mouth daily.    aspirin 325 MG tablet Take 325 mg by mouth daily.    atenolol (TENORMIN) 50 MG tablet TAKE 2 TABLETS BY MOUTH DAILY AS DIRECTED Qty: 60 tablet, Refills: 5   Associated Diagnoses: Essential hypertension    Cholecalciferol (VITAMIN D3) 2000 UNITS TABS Take 1 tablet by mouth daily.    cloNIDine (CATAPRES) 0.1 MG tablet TAKE ONE TABLET BY MOUTH THREE TIMES DAILY Qty: 90 tablet, Refills: 3    fluticasone (FLONASE) 50 MCG/ACT nasal spray Place 2 sprays into both nostrils daily. Qty: 16 g, Refills: 6   Associated Diagnoses: Other seasonal allergic rhinitis    levothyroxine (SYNTHROID, LEVOTHROID) 75 MCG tablet TAKE 1 TABLET (75 MCG TOTAL) BY MOUTH DAILY. Qty: 90 tablet, Refills: 1      STOP taking these medications     hydrocortisone 2.5 % cream      sulfamethoxazole-trimethoprim (BACTRIM DS,SEPTRA DS) 800-160 MG tablet               Relevant Imaging Results:  Relevant Lab Results:  Recent Labs    Additional Information [facility] to follow patient at facility  Derenda Fennel [place], [place] [phone]

## 2015-04-15 NOTE — Consult Note (Signed)
Consultation Note Date: 04/15/2015   Patient Name: Catherine Steele  DOB: Sep 06, 1918  MRN: [id number]  Age / Sex: 81 y.o., female   PCP: Junie Spencerhristy A Hawks, FNP Referring Physician: Houston SirenPeter Le, MD  Reason for Consultation: Establishing goals of care and Psychosocial/spiritual support  Palliative Care Assessment and Plan Summary of Established Goals of Care and Medical Treatment Preferences   Clinical Assessment/Narrative: Catherine Steele is resting quietly as I enter. She is able to greet me and make eye contact.  She appears to be very weak.  She allows me to help her sit up to eat lunch.  She has a difficult time feeding herself. I share that [facility] will be [facility] and she asks if she has improved.  We talk about her chronic illness, and that she will be cared for there.  She denies pain, anxiety. No family at bedside at this time. She is expected to discharge later today, so no call to set up family meeting as she will go with Hospice.   Contacts/Participants in Discussion: Primary Decision Maker: Catherine Steele is no longer able to make her own choices.  HCPOA: yes  Edwinna AreolaBritt Wilkins, and Niece Noah DelaineBetty Wilkins.   Code Status/Advance Care Planning:  DNR  [facility].   Symptom Management:   Tylenol 650 mg PO/PR Q 6 hours PRN  Zofrn 4 mg PO/IV Q 6 hours PRN.   Palliative Prophylaxis: None at this time.   Psycho-social/Spiritual:   Support System: [facility], no children, Nephew Engineer, manufacturing systemsBritt Wilkins   Desire for further Chaplaincy support:no  Prognosis: Unable to determine, Likely less than 6 months.   Discharge Planning:  Skilled Nursing Facility with Hospice       Chief Complaint:   Fatigue History of Present Illness:  Catherine Steele is a 81 y.o. female (Level V caveat: History limited due to patient's baseline dementia and worsening altered state due to illness).  Per report by ED physician, nursing home, and  patient's healthcare POA - Karl LukeBritt Wilkinson, patient was in her normal state of health up to a couple of days ago when she began to show evidence of generalized fatigue, weakness and very little oral intake. Patient is without focal complaint but does complain of generalized pain. Patient is confused at baseline but this has worsened over the last several days. Symptoms are constant and getting worse. Of note per Moshe CiproBritt, patient has had a market mental decline over the last 5-6 months.   Primary Diagnoses  Present on Admission:  . AKI (acute kidney injury) (HCC) . DNR (do not resuscitate) . Elevated troponin . Hypokalemia . Acute encephalopathy . Physical deconditioning  Palliative Review of Systems: Catherine Steele denies uncontrolled pain, anxiety, NV.  I have reviewed the medical record, interviewed the patient and family, and examined the patient. The following aspects are pertinent.  Past Medical History  Diagnosis Date  . Anxiety   . Hyperlipidemia   . Vertigo   . Carotid stenosis   . Vitamin D deficiency   . Hypertension   . Neuromuscular disorder (HCC)     post herpetic neuralgia  . Gallstones   . Arthritis   . Carotid stenosis   . Vitamin D deficiency   . DNR (do not resuscitate)   . Confusion    Social History   Social History  . Marital Status: Widowed    Spouse Name: N/A  . Number of Children: N/A  .  Years of Education: N/A   Social History Main Topics  . Smoking status: Never Smoker   . Smokeless tobacco: Never Used  . Alcohol Use: No  . Drug Use: No  . Sexual Activity: Not Currently   Other Topics Concern  . None   Social History Narrative   History reviewed. No pertinent family history. Scheduled Meds: . amLODipine  5 mg Oral Daily  . ARIPiprazole  2 mg Oral Daily  . aspirin  325 mg Oral Daily  . atenolol  100 mg Oral Daily  . cloNIDine  0.1 mg Oral TID  . heparin  5,000 Units Subcutaneous 3 times per day  . levothyroxine  75 mcg Oral QAC breakfast   . potassium chloride  40 mEq Oral Once  . sodium chloride  3 mL Intravenous Q12H   Continuous Infusions:  PRN Meds:.acetaminophen **OR** acetaminophen, ondansetron **OR** ondansetron (ZOFRAN) IV Medications Prior to Admission:  Prior to Admission medications   Medication Sig Start Date End Date Taking? Authorizing Provider  ALPRAZolam Prudy Feeler) 0.5 MG tablet Take 1 tablet (0.5 mg total) by mouth 2 (two) times daily as needed for anxiety (for restlessness and agitation). 02/20/15  Yes Junie Spencer, FNP  amLODipine (NORVASC) 5 MG tablet TAKE ONE TABLET BY MOUTH ONE  TIME DAILY 08/20/14  Yes Junie Spencer, FNP  ARIPiprazole (ABILIFY) 2 MG tablet Take 2 mg by mouth daily.   Yes Historical Provider, MD  aspirin 325 MG tablet Take 325 mg by mouth daily.   Yes Historical Provider, MD  atenolol (TENORMIN) 50 MG tablet TAKE 2 TABLETS BY MOUTH DAILY AS DIRECTED Patient taking differently: Take 100 mg by mouth daily.  08/14/14  Yes Junie Spencer, FNP  Cholecalciferol (VITAMIN D3) 2000 UNITS TABS Take 1 tablet by mouth daily.   Yes Historical Provider, MD  cloNIDine (CATAPRES) 0.1 MG tablet TAKE ONE TABLET BY MOUTH THREE TIMES DAILY 08/27/14  Yes Junie Spencer, FNP  fluticasone (FLONASE) 50 MCG/ACT nasal spray Place 2 sprays into both nostrils daily. 02/23/14  Yes Junie Spencer, FNP  levothyroxine (SYNTHROID, LEVOTHROID) 75 MCG tablet TAKE 1 TABLET (75 MCG TOTAL)  BY MOUTH DAILY. 11/27/14  Yes Junie Spencer, FNP  hydrocortisone 2.5 % cream Apply topically 2 (two) times daily. Let patient know when rx is ready for pick up Patient not taking: Reported on 04/09/2015 03/19/15   Junie Spencer, FNP  sulfamethoxazole-trimethoprim (BACTRIM DS,SEPTRA DS) 800-160 MG tablet Take 1 tablet by mouth 2 (two) times daily. Patient not taking: Reported on 04/09/2015 02/21/15   Junie Spencer, FNP   Allergies  Allergen Reactions  . Codeine     Unknown reaction  . Niacin And Related     Unknown reaction    CBC:    Component Value Date/Time   WBC 12.6* 04/12/2015 0543   WBC 10.2 02/01/2015 1755   WBC 8.9 08/11/2013 1103   HGB 14.2 04/12/2015 0543   HGB 13.6 08/11/2013 1103   HCT 44.3 04/12/2015 0543   HCT 46.2 02/01/2015 1755   HCT 44.6 08/11/2013 1103   PLT 137* 04/12/2015 0543   MCV 93.3 04/12/2015 0543   MCV 91.5 08/11/2013 1103   NEUTROABS 12.8* 04/09/2015 1503   NEUTROABS 6.4 02/01/2015 1755   LYMPHSABS 2.3 04/09/2015 1503   LYMPHSABS 2.6 02/01/2015 1755   MONOABS 1.5* 04/09/2015 1503   EOSABS 0.0 04/09/2015 1503   BASOSABS 0.0 04/09/2015 1503   BASOSABS 0.0 02/01/2015 1755   Comprehensive Metabolic Panel:  Component Value Date/Time   NA 140 04/15/2015 0609   NA 141 02/19/2015 1447   K 3.5 04/15/2015 0609   CL 100* 04/15/2015 0609   CO2 36* 04/15/2015 0609   BUN 8 04/15/2015 0609   BUN 15 02/19/2015 1447   CREATININE 0.59 04/15/2015 0609   CREATININE 0.98 11/23/2012 1243   GLUCOSE 79 04/15/2015 0609   GLUCOSE 105* 02/19/2015 1447   CALCIUM 7.7* 04/15/2015 0609   AST 36 04/09/2015 1503   ALT 27 04/09/2015 1503   ALKPHOS 78 04/09/2015 1503   BILITOT 2.3* 04/09/2015 1503   BILITOT 0.6 02/01/2015 1755   PROT 6.2* 04/09/2015 1503   PROT 5.8* 02/01/2015 1755   ALBUMIN 3.2* 04/09/2015 1503   ALBUMIN 3.5 02/01/2015 1755    Physical Exam: Vital Signs: BP 143/51 mmHg  Pulse 62  Temp(Src) 97.5 F (36.4 C) (Oral)  Resp 20  Ht 5' (1.524 m)  Wt 56.4 kg (124 lb 5.4 oz)  SpO2 100% SpO2: SpO2: 100 % O2 Device: O2 Device: Nasal Cannula O2 Flow Rate: O2 Flow Rate (L/min): 2 L/min Intake/output summary: No intake or output data in the 24 hours ending 04/15/15 1306 LBM: Last BM Date: 04/13/15 Baseline Weight: Weight: 56.4 kg (124 lb 5.4 oz) Most recent weight: Weight: 56.4 kg (124 lb 5.4 oz)  Exam Findings:  Constitutional:  Elderly, frail, lying in bed, Makes eye contact.  Resp:  Even and non labored.  Cardio: no edema GI: abd soft, non tender.           Palliative Performance Scale: 30% at this time.               Additional Data Reviewed: Recent Labs     04/14/15  0603  04/15/15  0609  NA  139  140  BUN  11  8  CREATININE  0.59  0.59     Time In: 1145 Time Out: 1220 Time Total: 35 minutes Greater than 50%  of this time was spent counseling and coordinating care related to the above assessment and plan.  Signed by: Katheran Awe, NP  Katheran Awe, NP  04/15/2015, 1:06 PM  Please contact Palliative Medicine Team phone at [phone] for questions and concerns.

## 2015-04-15 NOTE — Progress Notes (Signed)
[facility] staff called and said patient's 02 had arrived and they were ready to receive the patient. Secretary informed to let EMS know to come and get patient.

## 2015-04-16 ENCOUNTER — Telehealth: Payer: Self-pay | Admitting: *Deleted

## 2015-04-16 NOTE — Telephone Encounter (Signed)
Patient was discharged from the hospital and is in hospice care while living [facility] [place]. I spoke with her caregiver, Kathie RhodesBetty, and she asked that I cancel her appointment for next week. She doesn't feel that she will be able to make it.  They will call back if there is anything that we can assist them with.

## 2015-04-22 ENCOUNTER — Telehealth: Payer: Self-pay | Admitting: Family

## 2015-04-23 ENCOUNTER — Ambulatory Visit: Payer: Medicare Other | Admitting: Family

## 2015-04-23 NOTE — Telephone Encounter (Signed)
All papers signed and faxed

## 2015-05-01 ENCOUNTER — Telehealth: Payer: Self-pay

## 2015-05-01 NOTE — Telephone Encounter (Signed)
[facility] to come over to see Mrs. Camilo.  [facility] thinks death imminent and called to ask if we could stop all standing meds.  Jannifer Rodneyhristy Hawks not here so I asked Dr Museum/gallery exhibitions officerDettinger and he said if the family was OK with that.  I called Beth back and she said the family was so all standing meds will be stopped at this point

## 2015-05-08 ENCOUNTER — Other Ambulatory Visit: Payer: Self-pay | Admitting: Family

## 2015-05-19 DEATH — deceased

## 2015-07-02 ENCOUNTER — Ambulatory Visit: Payer: Medicare Other | Admitting: Family

## 2017-10-30 IMAGING — CR DG CHEST 1V PORT
1 series · 1 of 1 positions shown · non-contrast
Comparison: 02/01/2015

CLINICAL DATA: generalized weakness, fatigue, and malaise since
yesterday. Facility reports unable to ambulate ---- lack of appetite
for the past two days. Pt lethargic upon assessment.

EXAM:
PORTABLE CHEST - 1 VIEW

[ap portable]
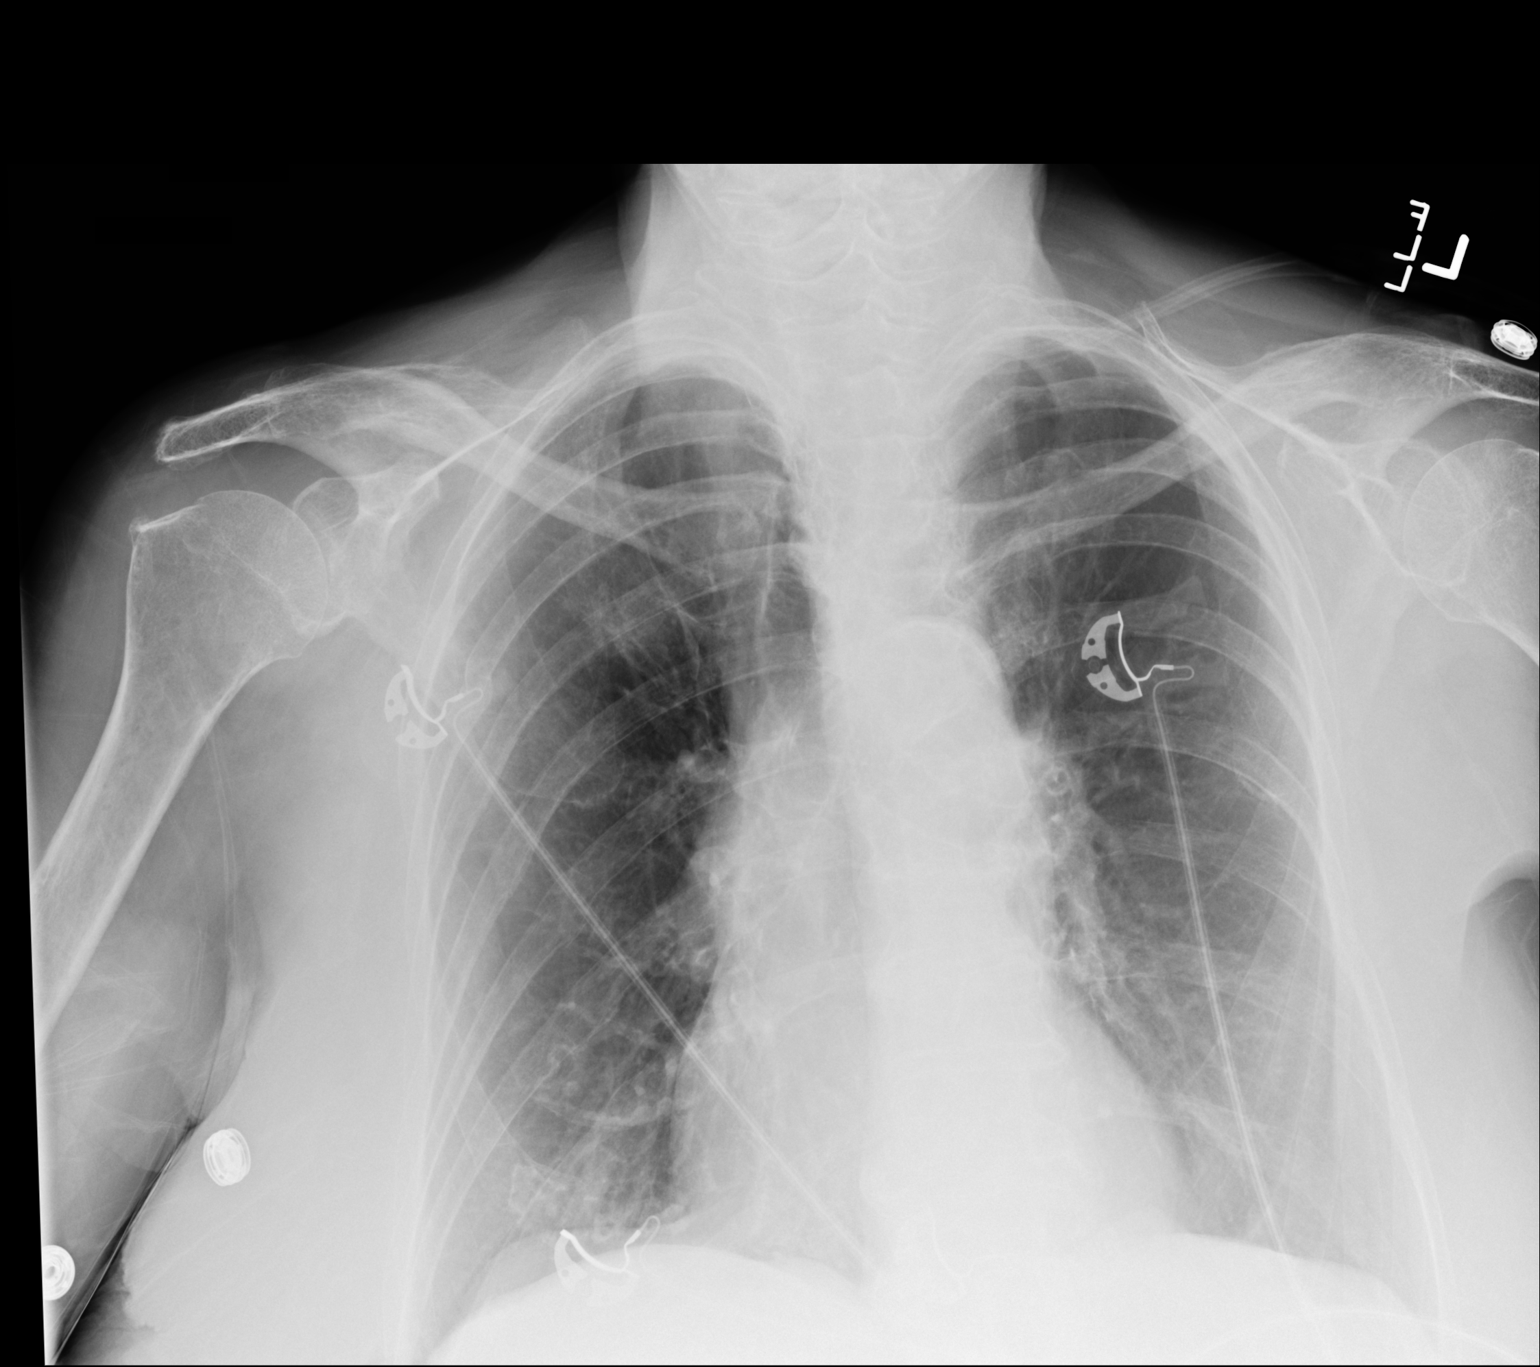

[1 of 1 positions shown; findings below may reference images not displayed]

FINDINGS: Lungs are clear. Heart size and mediastinal contours are within
normal limits.
No effusion.
Visualized skeletal structures are unremarkable.
IMPRESSION: No acute cardiopulmonary disease.
